# Patient Record
Sex: Female | Born: 1985 | Race: Black or African American | Hispanic: No | Marital: Married | Smoking: Former smoker
Health system: Southern US, Community
[De-identification: ages and names within clinical notes are randomized; demographics above are authoritative.]

## PROBLEM LIST (undated history)

## (undated) ENCOUNTER — Inpatient Hospital Stay (HOSPITAL_COMMUNITY): Payer: Self-pay

## (undated) DIAGNOSIS — IMO0002 Reserved for concepts with insufficient information to code with codable children: Secondary | ICD-10-CM

## (undated) DIAGNOSIS — B999 Unspecified infectious disease: Secondary | ICD-10-CM

## (undated) DIAGNOSIS — O34219 Maternal care for unspecified type scar from previous cesarean delivery: Secondary | ICD-10-CM

## (undated) DIAGNOSIS — T8859XA Other complications of anesthesia, initial encounter: Secondary | ICD-10-CM

## (undated) DIAGNOSIS — Z8619 Personal history of other infectious and parasitic diseases: Secondary | ICD-10-CM

## (undated) DIAGNOSIS — R102 Pelvic and perineal pain unspecified side: Secondary | ICD-10-CM

## (undated) DIAGNOSIS — N857 Hematometra: Secondary | ICD-10-CM

## (undated) DIAGNOSIS — Z973 Presence of spectacles and contact lenses: Secondary | ICD-10-CM

## (undated) DIAGNOSIS — I10 Essential (primary) hypertension: Secondary | ICD-10-CM

## (undated) DIAGNOSIS — T4145XA Adverse effect of unspecified anesthetic, initial encounter: Secondary | ICD-10-CM

## (undated) DIAGNOSIS — S4290XA Fracture of unspecified shoulder girdle, part unspecified, initial encounter for closed fracture: Secondary | ICD-10-CM

## (undated) DIAGNOSIS — R51 Headache: Secondary | ICD-10-CM

## (undated) HISTORY — DX: Fracture of unspecified shoulder girdle, part unspecified, initial encounter for closed fracture: S42.90XA

## (undated) HISTORY — PX: BREAST SURGERY: SHX581

## (undated) HISTORY — DX: Adverse effect of unspecified anesthetic, initial encounter: T41.45XA

## (undated) HISTORY — DX: Reserved for concepts with insufficient information to code with codable children: IMO0002

## (undated) HISTORY — DX: Unspecified infectious disease: B99.9

## (undated) HISTORY — DX: Essential (primary) hypertension: I10

## (undated) HISTORY — PX: REPAIR VAGINAL CUFF: SHX6067

## (undated) HISTORY — DX: Other complications of anesthesia, initial encounter: T88.59XA

## (undated) HISTORY — DX: Headache: R51

---
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
## 2006-09-10 DIAGNOSIS — B999 Unspecified infectious disease: Secondary | ICD-10-CM

## 2006-09-10 HISTORY — DX: Unspecified infectious disease: B99.9

## 2009-02-19 ENCOUNTER — Inpatient Hospital Stay (HOSPITAL_COMMUNITY): Admission: AD | Admit: 2009-02-19 | Discharge: 2009-02-20 | Payer: Self-pay | Admitting: Obstetrics and Gynecology

## 2009-04-07 ENCOUNTER — Inpatient Hospital Stay (HOSPITAL_COMMUNITY): Admission: AD | Admit: 2009-04-07 | Discharge: 2009-04-07 | Payer: Self-pay | Admitting: Obstetrics and Gynecology

## 2009-05-06 ENCOUNTER — Inpatient Hospital Stay (HOSPITAL_COMMUNITY): Admission: AD | Admit: 2009-05-06 | Discharge: 2009-05-06 | Payer: Self-pay | Admitting: Obstetrics and Gynecology

## 2009-05-23 ENCOUNTER — Inpatient Hospital Stay (HOSPITAL_COMMUNITY): Admission: AD | Admit: 2009-05-23 | Discharge: 2009-05-23 | Payer: Self-pay | Admitting: Obstetrics and Gynecology

## 2009-05-26 ENCOUNTER — Inpatient Hospital Stay (HOSPITAL_COMMUNITY): Admission: AD | Admit: 2009-05-26 | Discharge: 2009-05-31 | Payer: Self-pay | Admitting: Obstetrics and Gynecology

## 2009-05-28 ENCOUNTER — Encounter (INDEPENDENT_AMBULATORY_CARE_PROVIDER_SITE_OTHER): Payer: Self-pay | Admitting: Obstetrics and Gynecology

## 2009-08-05 IMAGING — US US OB TRANSVAGINAL
1 series · 14 of 14 positions shown · non-contrast
Comparison: none

OBSTETRICAL ULTRASOUND:
 This ultrasound exam was performed in the [HOSPITAL] Ultrasound Department.  The OB US report was generated in the AS system, and faxed to the ordering physician.  This report is also available in [REDACTED] PACS.

[Series 1: us ob transvaginal · 0.27mm/px · 14 of 14 slices shown]
[im 1/14]
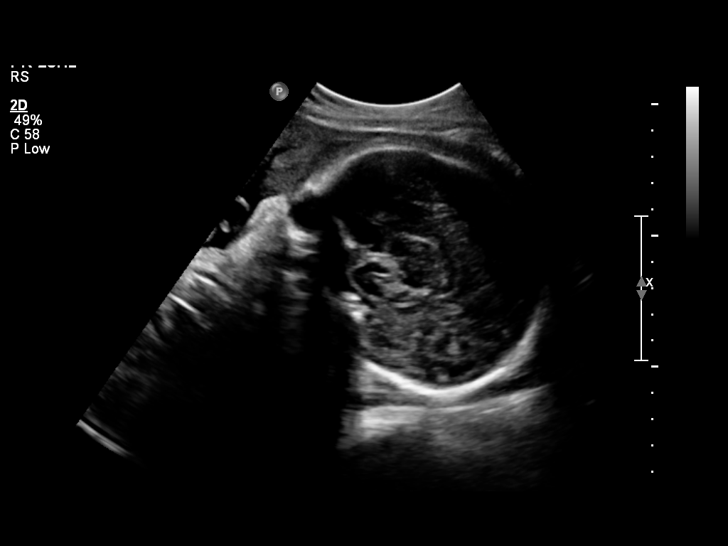
[im 2/14]
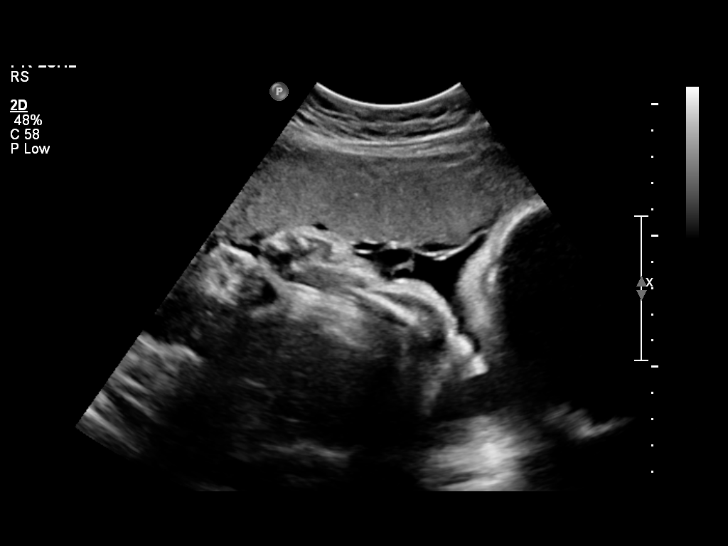
[im 3/14]
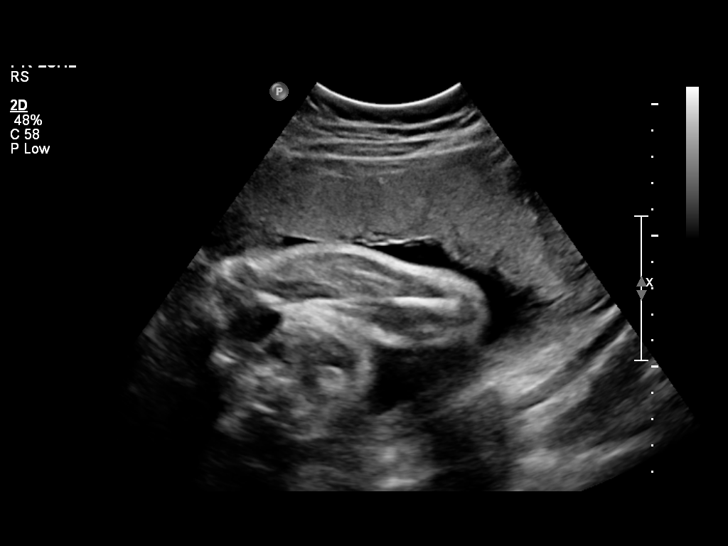
[im 4/14]
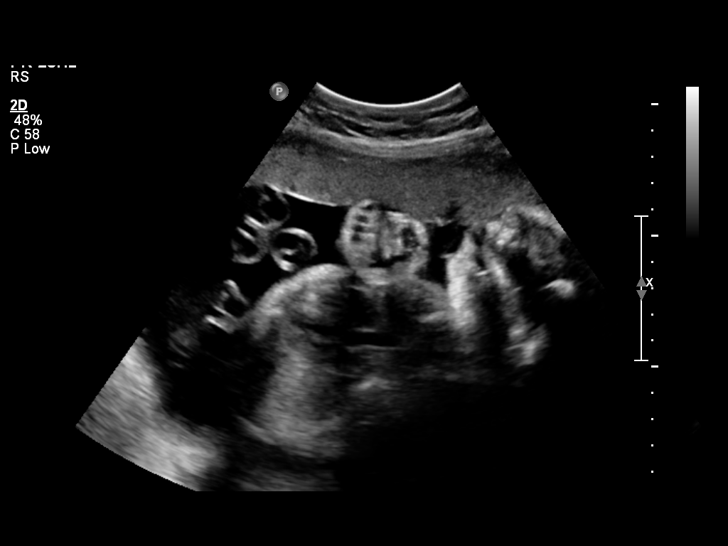
[im 5/14]
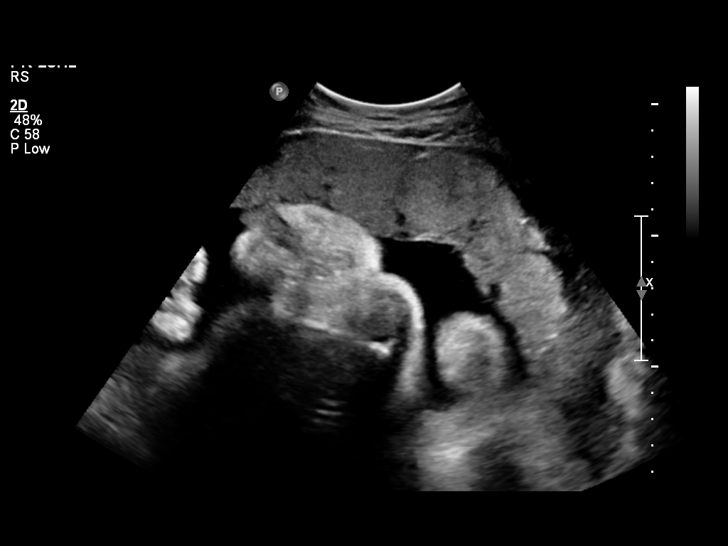
[im 6/14]
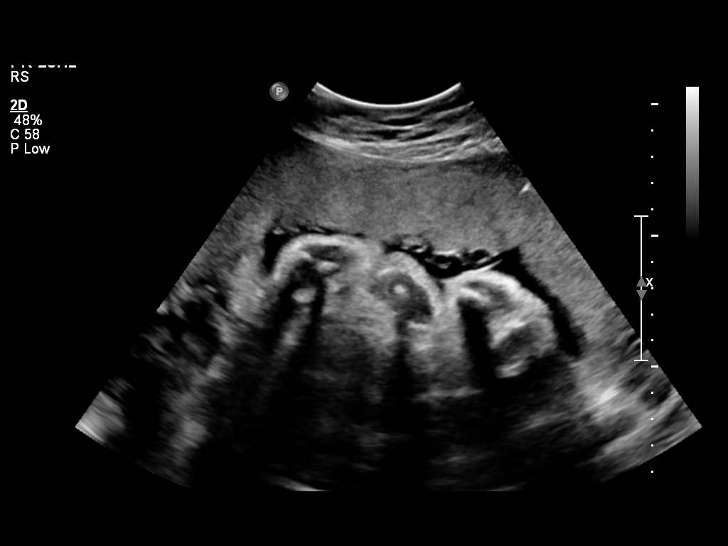
[im 7/14]
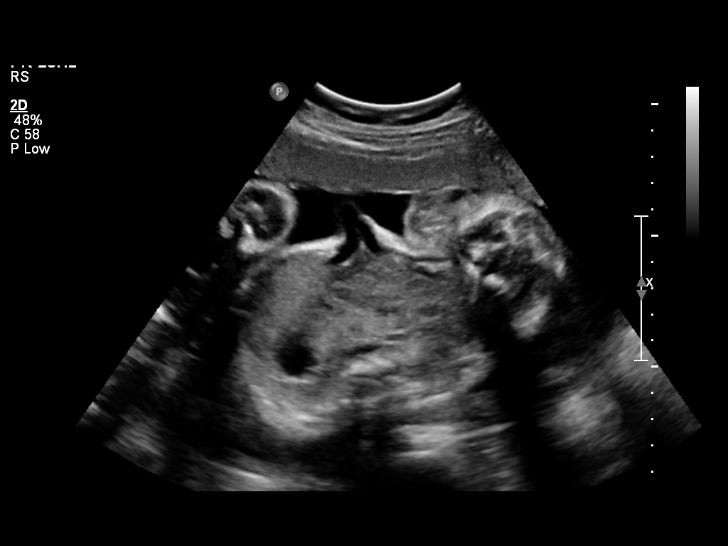
[im 8/14]
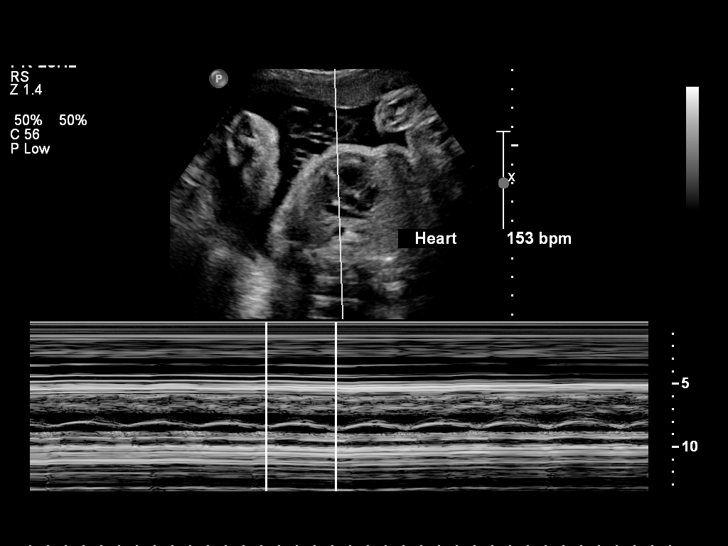
[im 9/14]
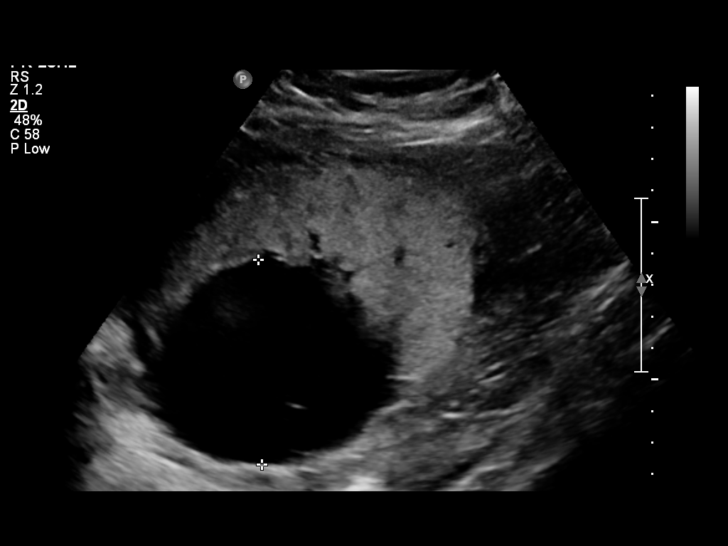
[im 10/14]
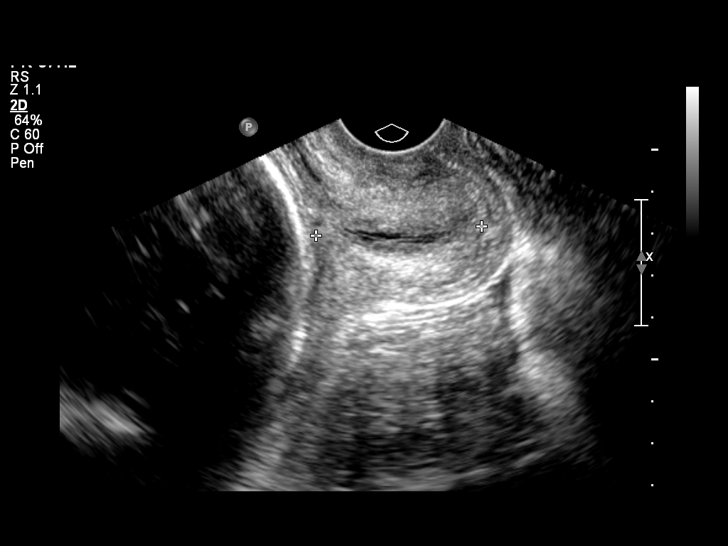
[im 11/14]
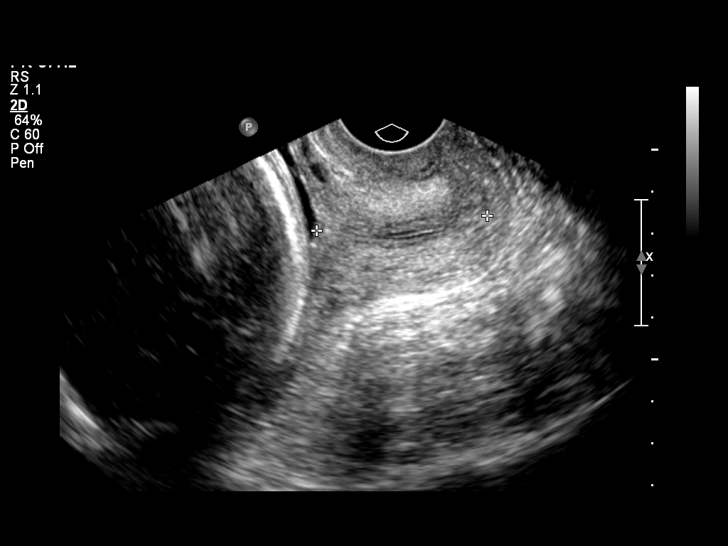
[im 12/14]
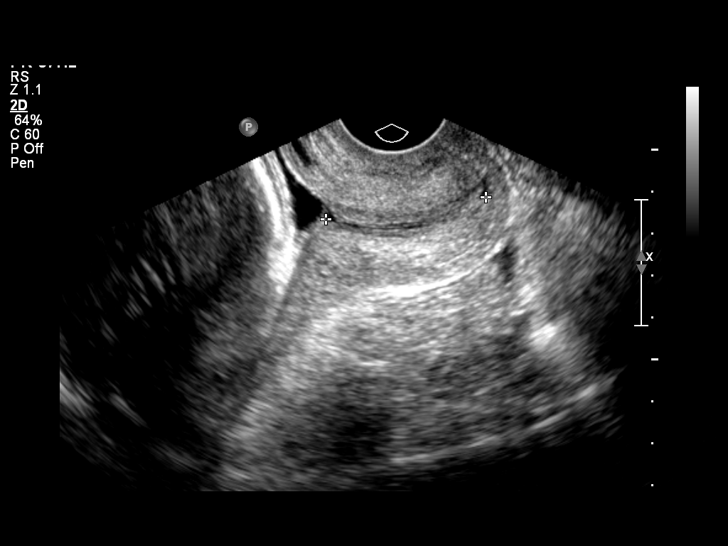
[im 13/14]
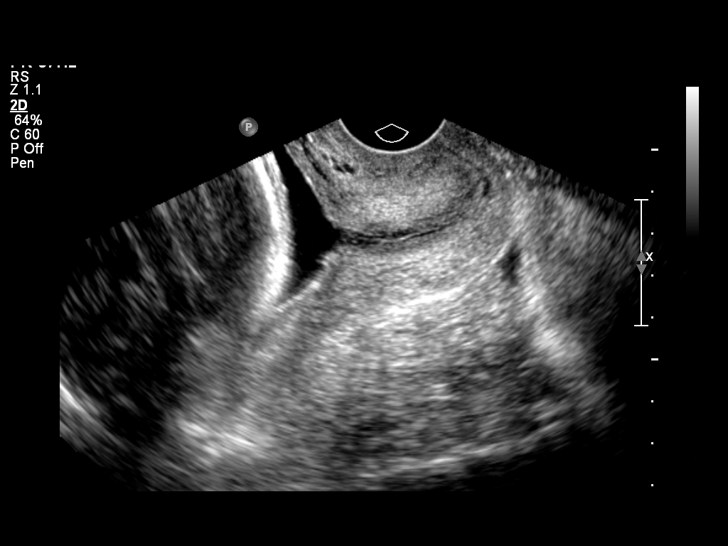
[im 14/14]
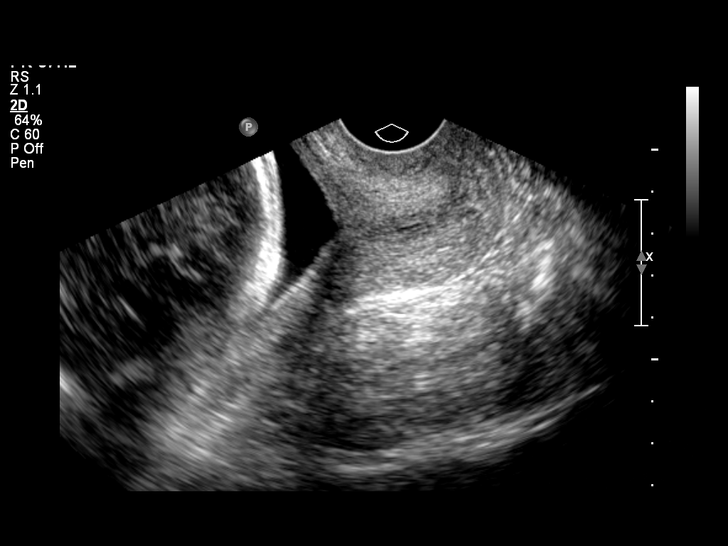

[14 of 14 positions shown; findings below may reference images not displayed]

IMPRESSION: See AS Obstetric US report.

## 2010-05-27 ENCOUNTER — Emergency Department (HOSPITAL_COMMUNITY): Admission: EM | Admit: 2010-05-27 | Discharge: 2010-05-27 | Payer: Self-pay | Admitting: Emergency Medicine

## 2010-09-10 DIAGNOSIS — Z8742 Personal history of other diseases of the female genital tract: Secondary | ICD-10-CM

## 2010-09-10 DIAGNOSIS — IMO0002 Reserved for concepts with insufficient information to code with codable children: Secondary | ICD-10-CM

## 2010-09-10 HISTORY — DX: Personal history of other diseases of the female genital tract: Z87.42

## 2010-09-10 HISTORY — DX: Reserved for concepts with insufficient information to code with codable children: IMO0002

## 2010-11-23 LAB — POCT I-STAT, CHEM 8
Chloride: 106 mEq/L (ref 96–112)
HCT: 43 % (ref 36.0–46.0)
Hemoglobin: 14.6 g/dL (ref 12.0–15.0)
Potassium: 3.6 mEq/L (ref 3.5–5.1)
Sodium: 141 mEq/L (ref 135–145)

## 2010-12-15 LAB — WET PREP, GENITAL
Trich, Wet Prep: NONE SEEN
Yeast Wet Prep HPF POC: NONE SEEN

## 2010-12-15 LAB — CBC
HCT: 27.3 % — ABNORMAL LOW (ref 36.0–46.0)
HCT: 36.2 % (ref 36.0–46.0)
Hemoglobin: 12.1 g/dL (ref 12.0–15.0)
Hemoglobin: 9.4 g/dL — ABNORMAL LOW (ref 12.0–15.0)
MCHC: 33.4 g/dL (ref 30.0–36.0)
MCHC: 34.3 g/dL (ref 30.0–36.0)
MCV: 87.5 fL (ref 78.0–100.0)
Platelets: 189 10*3/uL (ref 150–400)
RBC: 3.13 MIL/uL — ABNORMAL LOW (ref 3.87–5.11)
RBC: 4.13 MIL/uL (ref 3.87–5.11)
RDW: 13.5 % (ref 11.5–15.5)
WBC: 9.1 10*3/uL (ref 4.0–10.5)

## 2010-12-15 LAB — RPR: RPR Ser Ql: NONREACTIVE

## 2010-12-16 LAB — URINALYSIS, ROUTINE W REFLEX MICROSCOPIC
Bilirubin Urine: NEGATIVE
Glucose, UA: NEGATIVE mg/dL
Ketones, ur: 15 mg/dL — AB
Nitrite: NEGATIVE
Protein, ur: NEGATIVE mg/dL

## 2010-12-17 LAB — URINALYSIS, ROUTINE W REFLEX MICROSCOPIC
Glucose, UA: 100 mg/dL — AB
Ketones, ur: NEGATIVE mg/dL
Specific Gravity, Urine: <1.005 — ABNORMAL LOW (ref 1.005–1.030)
pH: 7 (ref 5.0–8.0)

## 2010-12-17 LAB — WET PREP, GENITAL: Yeast Wet Prep HPF POC: NONE SEEN

## 2010-12-17 LAB — URINE MICROSCOPIC-ADD ON

## 2010-12-18 LAB — URINALYSIS, ROUTINE W REFLEX MICROSCOPIC
Bilirubin Urine: NEGATIVE
Glucose, UA: NEGATIVE mg/dL
Hgb urine dipstick: NEGATIVE
Ketones, ur: NEGATIVE mg/dL
Nitrite: NEGATIVE
Specific Gravity, Urine: <1.005 — ABNORMAL LOW (ref 1.005–1.030)
pH: 7 (ref 5.0–8.0)

## 2010-12-18 LAB — FETAL FIBRONECTIN: Fetal Fibronectin: NEGATIVE

## 2010-12-18 LAB — WET PREP, GENITAL
Trich, Wet Prep: NONE SEEN
Yeast Wet Prep HPF POC: NONE SEEN

## 2010-12-18 LAB — GC/CHLAMYDIA PROBE AMP, GENITAL: Chlamydia, DNA Probe: NEGATIVE

## 2011-01-26 NOTE — Op Note (Signed)
   Donna [name], Donna [name]                          ACCOUNT NO.:  [id number]   MEDICAL RECORD NO.:  [id number]                   PATIENT TYPE:  OIB   LOCATION:  2899                                 FACILITY:  [facility]   PHYSICIAN:  Leonie Man, M.D.                DATE OF BIRTH:  [birth year]   DATE OF PROCEDURE:  03/19/2003  DATE OF DISCHARGE:                                 OPERATIVE REPORT   PREOPERATIVE DIAGNOSIS:  Fibroadenoma, left breast.   POSTOPERATIVE DIAGNOSIS:  Fibroadenoma, left breast, path pending.   PROCEDURE:  Excisional biopsy of left breast mass.   SURGEON:  Mardene Celeste. Lurene Shadow, M.D.   ASSISTANT:  Nurse   ANESTHESIA:  General.   This patient is a 25 year old female with an enlarging mass in the left  breast.  On palpation, is clinically a fibroadenoma.  She comes to the  operating room for excision of this mass after a full discussion with her  and her mother concerning the risks and potential benefits of surgery.  They  gave consent and the patient comes to the operating room for excision of  this left breast mass.   PROCEDURE:  Following the induction of satisfactory general anesthesia, the  patient was positioned supine.  The left breast was prepped and draped to be  included in the sterile operative field.  A circumareolar incision on the  upper outer quadrant of the areolar border was deepened through the skin and  subcutaneous tissue.  A flap is raised superiorly and laterally and  dissection was carried up to the region of the mass.  The mass was dissected  free from the surrounding breast tissues and removed in its entirety and  forwarded for pathologic evaluation.  Hemostasis was obtained with  electrocautery.  The breast tissues were reapproximated with 2-0 Vicryl  suture, the subcutaneous tissue were closed with interrupted 3-0 Vicryl  sutures, and the skin was closed with running 5-0 Monocryl suture reinforced  with Steri-Strips.  Sterile dressings  were applied, the anesthetic reversed,  and the patient removed from the operating room to the recovery room in  stable condition, having tolerated the procedure well.                                               Leonie Man, M.D.    PB/MEDQ  D:  03/19/2003  T:  03/19/2003  Job:  [id number]

## 2011-03-17 ENCOUNTER — Encounter: Payer: Self-pay | Admitting: Family Medicine

## 2011-03-17 ENCOUNTER — Inpatient Hospital Stay (INDEPENDENT_AMBULATORY_CARE_PROVIDER_SITE_OTHER)
Admission: RE | Admit: 2011-03-17 | Discharge: 2011-03-17 | Disposition: A | Discharge status: Home / Self Care | Payer: BC Managed Care – PPO | Source: Ambulatory Visit | Attending: Family Medicine | Admitting: Family Medicine

## 2011-03-17 DIAGNOSIS — IMO0002 Reserved for concepts with insufficient information to code with codable children: Secondary | ICD-10-CM

## 2011-03-17 DIAGNOSIS — M75100 Unspecified rotator cuff tear or rupture of unspecified shoulder, not specified as traumatic: Secondary | ICD-10-CM

## 2011-03-17 DIAGNOSIS — M7520 Bicipital tendinitis, unspecified shoulder: Secondary | ICD-10-CM

## 2011-08-13 NOTE — Letter (Signed)
Summary: Out of Work  [facility]  [address]   [place], [place] [address]   Phone: [phone]  Fax: [phone]    March 17, 2011   Employee:  Nani Skillern    To Whom It May Concern:   Katia was evaluated in our clinic today.   If you need additional information, please feel free to contact our office.         Sincerely,    Donna Christen MD

## 2011-08-13 NOTE — Progress Notes (Signed)
Summary: RT SHOULDER SORENESS (room 5)   Vital Signs:  Patient Profile:   25 Years Old Female CC:      right shoulder pain and decrease ROM several days Height:     64 inches Weight:      143 pounds O2 Sat:      100 % O2 treatment:    Room Air Temp:     98.8 degrees F oral Pulse rate:   82 / minute Resp:     16 per minute BP sitting:   103 / 65  (left arm) Cuff size:   regular  Pt. in pain?   yes    Location:   right shoulder  Vitals Entered By: Lavell Islam RN (March 17, 2011 2:22 PM)                   Prior Medication List:  No prior medications documented  Updated Prior Medication List: No Medications Current Allergies: ! VICODIN (HYDROCODONE-ACETAMINOPHEN)History of Present Illness Chief Complaint: right shoulder pain and decrease ROM several days History of Present Illness:  Subjective:  Patient complains of 2 day history of vague dull pain in her right shoulder area, worse with movement of her right arm.  She recalls no recent injury to the area or significant change in physical activities.  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  The pain has not improved much with Ibuprofen.  The pain does not radiate.  No chest pain.  REVIEW OF SYSTEMS Constitutional Symptoms      Denies fever, chills, night sweats, weight loss, weight gain, and fatigue.  Eyes       Denies change in vision, eye pain, eye discharge, glasses, contact lenses, and eye surgery. Ear/Nose/Throat/Mouth       Denies hearing loss/aids, change in hearing, ear pain, ear discharge, dizziness, frequent runny nose, frequent nose bleeds, sinus problems, sore throat, hoarseness, and tooth pain or bleeding.  Respiratory       Denies dry cough, productive cough, wheezing, shortness of breath, asthma, bronchitis, and emphysema/COPD.  Cardiovascular       Denies murmurs, chest pain, and tires easily with exhertion.    Gastrointestinal       Denies stomach pain, nausea/vomiting, diarrhea,  constipation, blood in bowel movements, and indigestion. Genitourniary       Denies painful urination, kidney stones, and loss of urinary control. Neurological       Denies paralysis, seizures, and fainting/blackouts. Musculoskeletal       Complains of muscle pain, joint pain, and joint stiffness.      Denies decreased range of motion, redness, swelling, muscle weakness, and gout.      Comments: right shoulder Skin       Denies bruising, unusual mles/lumps or sores, and hair/skin or nail changes.  Psych       Denies mood changes, temper/anger issues, anxiety/stress, speech problems, depression, and sleep problems. Other Comments: right shoulder pain and decreased rom   Past History:  Family History: Last updated: 03/17/2011 Family History Hypertension cancer  Social History: Last updated: 03/17/2011 Never Smoked Alcohol use-no Drug use-no works in retirement center; heavy lifting  Past Medical History: Unremarkable  Past Surgical History: Caesarean section Left breast cyst removal right shoulder surgery in childhood  Family History: Family History Hypertension cancer  Social History: Never Smoked Alcohol use-no Drug use-no works in retirement center; heavy lifting Smoking Status:  never Drug Use:  no   Objective:  Appearance:  Patient appears  healthy, stated age, and in no acute distress  Pharynx:  Normal  Neck:  Supple.  No adenopathy is present.  No tenderness Lungs:  Clear to auscultation.  Breath sounds are equal.  Heart:  Regular rate and rhythm without murmurs, rubs, or gallops.  Right shoulder:  Good range of motion although has mild discomfort with abduction above horizontal.  Distinct tenderness over insertion of biceps tendons, especially with resisted flexion of right elbow.  There is distinct tenderness at distal site of sub-acromial bursa.  Distal neurovascular intact   Assessment New Problems: SUBACROMIAL BURSITIS, RIGHT (ICD-726.19) BICEPS  TENDINITIS, RIGHT (ICD-726.12)   Plan New Orders: Services provided After hours-Weekends-Holidays [99051] New Patient Level III [99203] Planning Comments:   Begin Ibuprofen 200mg , 4 tabs every 8 hours with food.  Begin applying ice pack several times daily.  Begin range of motion and stretching exercises ([facility] information and instruction patient handout given)  Followup with [facility] if not improved in two weeks.    Diagnoses and expected course of recovery discussed and will return if not improved as expected or if the condition worsens. Patient and/or caregiver verbalized understanding.   Orders Added: 1)  Services provided After hours-Weekends-Holidays [99051] 2)  New Patient Level III [96045]

## 2011-08-17 ENCOUNTER — Emergency Department (HOSPITAL_COMMUNITY)
Admission: EM | Admit: 2011-08-17 | Discharge: 2011-08-18 | Disposition: A | Discharge status: Home / Self Care | Payer: BC Managed Care – PPO | Attending: Emergency Medicine | Admitting: Emergency Medicine

## 2011-08-17 ENCOUNTER — Encounter: Payer: Self-pay | Admitting: *Deleted

## 2011-08-17 DIAGNOSIS — M545 Low back pain, unspecified: Secondary | ICD-10-CM | POA: Insufficient documentation

## 2011-08-17 LAB — URINALYSIS, ROUTINE W REFLEX MICROSCOPIC
Bilirubin Urine: NEGATIVE
Hgb urine dipstick: NEGATIVE
Ketones, ur: NEGATIVE mg/dL
Nitrite: NEGATIVE
Protein, ur: NEGATIVE mg/dL
Urobilinogen, UA: 1 mg/dL (ref 0.0–1.0)

## 2011-08-17 NOTE — ED Notes (Signed)
C/o back for 1 year, on & off for 1 year, recent UTI dx'd at [facility]. Also nausea, HA, recent cold sx, intermittant chills & mentions L leg tingling (mild), (denies: radiation of pain, fever, bleeding, vd, urinary or vaginal sx). Taking cipro.

## 2011-08-18 ENCOUNTER — Emergency Department (HOSPITAL_COMMUNITY): Payer: BC Managed Care – PPO

## 2011-08-18 ENCOUNTER — Encounter (HOSPITAL_COMMUNITY): Payer: Self-pay | Admitting: Emergency Medicine

## 2011-08-18 MED ORDER — TRAMADOL HCL 50 MG PO TABS: 50.0000 mg | ORAL_TABLET | Freq: Four times a day (QID) | ORAL | Status: AC | PRN

## 2011-08-18 MED ORDER — METHOCARBAMOL 500 MG PO TABS
500.0000 mg | ORAL_TABLET | Freq: Once | ORAL | Status: AC
  Administered 2011-08-18: 500 mg via ORAL
  Filled 2011-08-18: qty 1

## 2011-08-18 MED ORDER — METHOCARBAMOL 500 MG PO TABS: 500.0000 mg | ORAL_TABLET | Freq: Two times a day (BID) | ORAL | Status: AC

## 2011-08-18 NOTE — ED Notes (Signed)
Rx x 2, pt voiced understanding to f/u with PCP and return for worsening of sx.

## 2011-08-18 NOTE — ED Provider Notes (Signed)
History     CSN: [id number] Arrival date & time: 08/17/2011  9:19 PM   First MD Initiated Contact with Patient 08/18/11 [phone]      Chief Complaint  Patient presents with  . Back Pain     HPI  History provided by the patient and significant other. Patient presents with complaints of increased low back pain and discomfort. Patient reports similar symptoms off and on for the previous year. Toes are worse with prolonged sitting or some movements. She states she was seen recently in [facility] for similar symptoms and states she was found to have a urinary tract infection for which she was taking antibiotics. She states she's had no improvement of symptoms since taking antibiotics. Patient denies any dysuria, urinary frequency, hematuria, vaginal discharge, vaginal bleeding, vaginal pain. Patient denies any recent weight loss, night sweats, numbness, tingling, weakness in lower extremities, urinary or bowel incontinence. She has had no trauma or significant injury. 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 She does not note any significant incident recently. Patient denies any other significant past medical history   History reviewed. No pertinent past medical history.  Past Surgical History  Procedure Date  . Breast surgery   . Cesarean section     Family History  Problem Relation Age of Onset  . Hypertension Mother   . Cancer Father     History  Substance Use Topics  . Smoking status: Never Smoker   . Smokeless tobacco: Not on file  . Alcohol Use: No    OB History    Grav Para Term Preterm Abortions TAB SAB Ect Mult Living                  Review of Systems  Constitutional: Negative for fever, chills and unexpected weight change.  Respiratory: Negative for cough and shortness of breath.   Cardiovascular: Negative for chest pain.  Gastrointestinal: Negative for nausea, vomiting, abdominal pain, diarrhea and constipation.  Genitourinary:  Negative for dysuria, frequency, hematuria, flank pain, vaginal bleeding, vaginal discharge, vaginal pain and menstrual problem.  Musculoskeletal: Positive for back pain. Negative for arthralgias and gait problem.  Skin: Negative for rash.  All other systems reviewed and are negative.    Allergies  Hydrocodone-acetaminophen  Home Medications   Current Outpatient Rx  Name Route Sig Dispense Refill  . CIPROFLOXACIN HCL 500 MG PO TABS Oral Take 500 mg by mouth 2 (two) times daily. For 5 days; Start 08/12/11     . MELOXICAM 15 MG PO TABS Oral Take 15 mg by mouth daily as needed. For pain/inflammation        BP 109/69  Pulse 89  Temp(Src) 98.1 F (36.7 C) (Oral)  Resp 17  SpO2 98%  LMP 07/30/2011  Physical Exam  Nursing note and vitals reviewed. Constitutional: She is oriented to person, place, and time. She appears well-developed and well-nourished. No distress.  HENT:  Head: Normocephalic.  Neck: Normal range of motion. Neck supple.  Cardiovascular: Normal rate, regular rhythm and normal heart sounds.   Pulmonary/Chest: Effort normal and breath sounds normal. No respiratory distress. She has no wheezes.  Abdominal: Soft. She exhibits no distension. There is no tenderness. There is no rebound and no guarding.  Musculoskeletal:       Cervical back: Normal.       Thoracic back: Normal.       Lumbar back: She exhibits tenderness. She exhibits normal range of motion, no bony  tenderness, no swelling and no deformity.       Paralumbar spinal tenderness.  Neurological: She is alert and oriented to person, place, and time. She has normal strength. No cranial nerve deficit or sensory deficit. Gait normal.  Skin: Skin is warm. No rash noted.  Psychiatric: She has a normal mood and affect. Her behavior is normal.    ED Course  Procedures (including critical care time)  Labs Reviewed  URINALYSIS, ROUTINE W REFLEX MICROSCOPIC - Abnormal; Notable for the following:    Specific  Gravity, Urine 1.036 (*)    All other components within normal limits  POCT PREGNANCY, URINE  POCT PREGNANCY, URINE   Results for orders placed during the hospital encounter of 08/17/11  URINALYSIS, ROUTINE W REFLEX MICROSCOPIC      Component Value Range   Color, Urine YELLOW  YELLOW    APPearance CLEAR  CLEAR    Specific Gravity, Urine 1.036 (*) 1.005 - 1.030    pH 7.5  5.0 - 8.0    Glucose, UA NEGATIVE  NEGATIVE (mg/dL)   Hgb urine dipstick NEGATIVE  NEGATIVE    Bilirubin Urine NEGATIVE  NEGATIVE    Ketones, ur NEGATIVE  NEGATIVE (mg/dL)   Protein, ur NEGATIVE  NEGATIVE (mg/dL)   Urobilinogen, UA 1.0  0.0 - 1.0 (mg/dL)   Nitrite NEGATIVE  NEGATIVE    Leukocytes, UA NEGATIVE  NEGATIVE   POCT PREGNANCY, URINE      Component Value Range   Preg Test, Ur NEGATIVE       Dg Lumbar Spine Complete  08/18/2011  *RADIOLOGY REPORT*  Clinical Data: Nontraumatic low back pain which has worsened steadily over the past 3 months.  LUMBAR SPINE - COMPLETE 4+ VIEW 08/18/2011:  Comparison: None.  Findings: Five non-rib bearing lumbar vertebra with anatomic alignment.  No visible fractures.  Well-preserved disc spaces.  No pars defects.  No significant facet arthropathy.  No evidence of spondylosis.  Visualized sacroiliac joints intact.  IMPRESSION: Normal examination.  Original Report Authenticated By: Arnell Sieving, M.D.     1. Low back pain       MDM  1:00 AM patient seen and evaluated. Patient in no acute distress. Patient with normal gait. No red flags for low back pain.        Angus Seller, [place] 08/18/11 [phone]

## 2011-08-19 NOTE — ED Provider Notes (Signed)
Medical screening examination/treatment/procedure(s) were performed by non-physician practitioner and as supervising physician I was immediately available for consultation/collaboration.   Kealan Buchan, MD 08/19/11 0726 

## 2012-01-22 ENCOUNTER — Encounter: Payer: Self-pay | Admitting: Obstetrics and Gynecology

## 2012-01-22 ENCOUNTER — Ambulatory Visit (INDEPENDENT_AMBULATORY_CARE_PROVIDER_SITE_OTHER): Payer: BC Managed Care – PPO | Admitting: Obstetrics and Gynecology

## 2012-01-22 VITALS — BP 118/72 | Resp 16 | Wt 155.0 lb

## 2012-01-22 DIAGNOSIS — Z309 Encounter for contraceptive management, unspecified: Secondary | ICD-10-CM

## 2012-01-22 DIAGNOSIS — B977 Papillomavirus as the cause of diseases classified elsewhere: Secondary | ICD-10-CM

## 2012-01-22 DIAGNOSIS — N946 Dysmenorrhea, unspecified: Secondary | ICD-10-CM

## 2012-01-22 DIAGNOSIS — N870 Mild cervical dysplasia: Secondary | ICD-10-CM

## 2012-01-22 DIAGNOSIS — R8789 Other abnormal findings in specimens from female genital organs: Secondary | ICD-10-CM

## 2012-01-22 DIAGNOSIS — Z98891 History of uterine scar from previous surgery: Secondary | ICD-10-CM

## 2012-01-22 DIAGNOSIS — Z9889 Other specified postprocedural states: Secondary | ICD-10-CM

## 2012-01-22 DIAGNOSIS — B009 Herpesviral infection, unspecified: Secondary | ICD-10-CM

## 2012-01-22 NOTE — Progress Notes (Signed)
Ms. Donna [name] is a 26 y.o. year old female,G1P1001, who presents for removal of Implanon that was placed 3 years ago.  Subjective:  Ready to get pregnant.  Objective:  BP 118/72  Resp 16  Wt 155 lb (70.308 kg)  LMP 01/17/2012   Procedure:  Betadine prepped the left upper arm.  2 cc of buffered lidocaine injected into the skin.  Incision made at the site of Implanon.  Implant removed without difficulty.  Tolerated well.  A sterile wrap applied.  Exam deferred.  Assessment:  Ready to get pregnant. Implanon removal.  Plan:  Preconception issues reviewed.  Prenatal vitamins, tuna, soft cheese, lunch meat, and healthy pregnancy issues outlined.  Return to office prn if symptoms worsen or fail to improve.   Leonard Schwartz M.D.  01/22/2012 9:00 PM

## 2012-06-05 ENCOUNTER — Telehealth: Payer: Self-pay | Admitting: Obstetrics and Gynecology

## 2012-06-06 ENCOUNTER — Other Ambulatory Visit (INDEPENDENT_AMBULATORY_CARE_PROVIDER_SITE_OTHER): Payer: BC Managed Care – PPO

## 2012-06-06 DIAGNOSIS — N912 Amenorrhea, unspecified: Secondary | ICD-10-CM

## 2012-06-06 LAB — POCT URINE PREGNANCY: Preg Test, Ur: POSITIVE

## 2012-06-06 NOTE — Progress Notes (Unsigned)
Pt here for UPT -Positive.  Pt is 4 weeks 2 days.  Pt to sch NOB work-up and interview.  PNV samples given.

## 2012-06-13 ENCOUNTER — Telehealth: Payer: Self-pay | Admitting: Obstetrics and Gynecology

## 2012-06-13 NOTE — Telephone Encounter (Signed)
TC from pt. States LMP 05/06/12.  +UPT.   Is having abd cramping in umbilical area.  +Nausea. No bleeding.  Has not been having normal BM, somewhat constipated. Discussed diet and comfort measures for nausea, constipation, advised 8-10 glasses water/day. May take Colace.  To call if no improvement or any concerns. Pt verbalizes comprehension.

## 2012-06-20 ENCOUNTER — Ambulatory Visit (INDEPENDENT_AMBULATORY_CARE_PROVIDER_SITE_OTHER): Payer: BC Managed Care – PPO | Admitting: Obstetrics and Gynecology

## 2012-06-20 DIAGNOSIS — Z331 Pregnant state, incidental: Secondary | ICD-10-CM

## 2012-06-20 LAB — POCT URINALYSIS DIPSTICK
Bilirubin, UA: NEGATIVE
Blood, UA: NEGATIVE
Ketones, UA: 2
pH, UA: 6

## 2012-06-20 NOTE — Progress Notes (Signed)
NOB interview. Ketones in urine.  Pt states had not eaten today.  Discussed small frequent meals. Pt verbalizes comprehension.

## 2012-06-21 LAB — PRENATAL PANEL VII
Antibody Screen: NEGATIVE
Basophils Absolute: 0 10*3/uL (ref 0.0–0.1)
Basophils Relative: 0 % (ref 0–1)
Eosinophils Relative: 2 % (ref 0–5)
HCT: 40 % (ref 36.0–46.0)
HIV: NONREACTIVE
Hemoglobin: 14.2 g/dL (ref 12.0–15.0)
Lymphocytes Relative: 24 % (ref 12–46)
MCHC: 35.5 g/dL (ref 30.0–36.0)
MCV: 83.7 fL (ref 78.0–100.0)
Monocytes Absolute: 0.5 10*3/uL (ref 0.1–1.0)
Monocytes Relative: 7 % (ref 3–12)
Neutro Abs: 4.5 10*3/uL (ref 1.7–7.7)
RDW: 12.5 % (ref 11.5–15.5)
Rh Type: POSITIVE

## 2012-07-03 ENCOUNTER — Telehealth: Payer: Self-pay | Admitting: Obstetrics and Gynecology

## 2012-07-03 ENCOUNTER — Encounter: Payer: BC Managed Care – PPO | Admitting: Obstetrics and Gynecology

## 2012-07-03 ENCOUNTER — Other Ambulatory Visit: Payer: Self-pay | Admitting: Obstetrics and Gynecology

## 2012-07-03 MED ORDER — PROMETHAZINE HCL 25 MG PO TABS: 25.0000 mg | ORAL_TABLET | Freq: Four times a day (QID) | ORAL | Status: DC | PRN

## 2012-07-03 MED ORDER — PROMETHAZINE HCL 12.5 MG RE SUPP: 12.5000 mg | Freq: Four times a day (QID) | RECTAL | Status: DC | PRN

## 2012-07-03 NOTE — Telephone Encounter (Signed)
Tc to pt regarding msg.  States had vomiting after lunch and breakfast today.  Has also had vomiting on other days as well and now she has abdominal pain and and has vomited up some blood.  Pt advised pain and bldg possibly d/t vomiting.  Denies having any anti-emetics.  Per SL can rx Phenergan po and suppository to pt's pharmacy.  Also advised pt to eat several small meals through out the day and me mindful of what she  Is eating, decrease fried, oily, fatty foods, and to take Tylenol for any pain, pt voices agreement, will back with any concerns.

## 2012-07-04 ENCOUNTER — Encounter: Payer: BC Managed Care – PPO | Admitting: Obstetrics and Gynecology

## 2012-07-07 ENCOUNTER — Encounter: Payer: BC Managed Care – PPO | Admitting: Obstetrics and Gynecology

## 2012-07-18 ENCOUNTER — Encounter (HOSPITAL_COMMUNITY): Payer: Self-pay | Admitting: Emergency Medicine

## 2012-07-18 ENCOUNTER — Emergency Department (HOSPITAL_COMMUNITY): Payer: BC Managed Care – PPO

## 2012-07-18 ENCOUNTER — Emergency Department (HOSPITAL_COMMUNITY)
Admission: EM | Admit: 2012-07-18 | Discharge: 2012-07-18 | Disposition: A | Discharge status: Home / Self Care | Payer: BC Managed Care – PPO | Attending: Emergency Medicine | Admitting: Emergency Medicine

## 2012-07-18 DIAGNOSIS — Z79899 Other long term (current) drug therapy: Secondary | ICD-10-CM | POA: Insufficient documentation

## 2012-07-18 DIAGNOSIS — Y9389 Activity, other specified: Secondary | ICD-10-CM | POA: Insufficient documentation

## 2012-07-18 DIAGNOSIS — Z87828 Personal history of other (healed) physical injury and trauma: Secondary | ICD-10-CM | POA: Insufficient documentation

## 2012-07-18 DIAGNOSIS — R51 Headache: Secondary | ICD-10-CM | POA: Insufficient documentation

## 2012-07-18 DIAGNOSIS — Z331 Pregnant state, incidental: Secondary | ICD-10-CM | POA: Insufficient documentation

## 2012-07-18 DIAGNOSIS — I151 Hypertension secondary to other renal disorders: Secondary | ICD-10-CM | POA: Insufficient documentation

## 2012-07-18 DIAGNOSIS — S0990XA Unspecified injury of head, initial encounter: Secondary | ICD-10-CM | POA: Insufficient documentation

## 2012-07-18 NOTE — ED Notes (Signed)
G2 P1 A 0 L1 °
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
## 2012-07-18 NOTE — ED Notes (Signed)
Pt remains in US at this time.
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
## 2012-07-18 NOTE — ED Notes (Signed)
Patient transported to CT 

## 2012-07-18 NOTE — ED Provider Notes (Signed)
Medical screening examination/treatment/procedure(s) were conducted as a shared visit with non-physician practitioner(s) and myself.  I personally evaluated the patient during the encounter.  Porche Steinberger R. Reather Steller, MD 07/18/12 1612 

## 2012-07-18 NOTE — ED Notes (Signed)
No abd pain  Or cramping she states just wants to be checked out no numbness or tingling. she states she is now having neck pain . Pt tearful
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
## 2012-07-18 NOTE — ED Notes (Signed)
Unable to auscultate fetal HT r/t gestational age

## 2012-07-21 ENCOUNTER — Encounter: Payer: Self-pay | Admitting: Obstetrics and Gynecology

## 2012-07-21 ENCOUNTER — Ambulatory Visit (INDEPENDENT_AMBULATORY_CARE_PROVIDER_SITE_OTHER): Payer: BC Managed Care – PPO | Admitting: Obstetrics and Gynecology

## 2012-07-21 DIAGNOSIS — Z331 Pregnant state, incidental: Secondary | ICD-10-CM

## 2012-07-21 NOTE — Patient Instructions (Addendum)
[email or web address]-[email or web address] - international cesarean awareness network, information about VBAC     ABCs of Pregnancy A Antepartum care is very important. Be sure you see your doctor and get prenatal care as soon as you think you are pregnant. At this time, you will be tested for infection, genetic abnormalities and potential problems with you and the pregnancy. This is the time to discuss diet, exercise, work, medications, labor, pain medication during labor and the possibility of a cesarean delivery. Ask any questions that may concern you. It is important to see your doctor regularly throughout your pregnancy. Avoid exposure to toxic substances and chemicals - such as cleaning solvents, lead and mercury, some insecticides, and paint. Pregnant women should avoid exposure to paint fumes, and fumes that cause you to feel ill, dizzy or faint. When possible, it is a good idea to have a pre-pregnancy consultation with your caregiver to begin some important recommendations your caregiver suggests such as, taking folic acid, exercising, quitting smoking, avoiding alcoholic beverages, etc. B Breastfeeding is the healthiest choice for both you and your baby. It has many nutritional benefits for the baby and health benefits for the mother. It also creates a very tight and loving bond between the baby and mother. Talk to your doctor, your family and friends, and your employer about how you choose to feed your baby and how they can support you in your decision. Not all birth defects can be prevented, but a woman can take actions that may increase her chance of having a healthy baby. Many birth defects happen very early in pregnancy, sometimes before a woman even knows she is pregnant. Birth defects or abnormalities of any child in your or the father's family should be discussed with your caregiver. Get a good support bra as your breast size changes. Wear it especially when you exercise and when nursing.  C Celebrate the news of your  pregnancy with the your spouse/father and family. Childbirth classes are helpful to take for you and the spouse/father because it helps to understand what happens during the pregnancy, labor and delivery. Cesarean delivery should be discussed with your doctor so you are prepared for that possibility. The pros and cons of circumcision if it is a boy, should be discussed with your pediatrician. Cigarette smoking during pregnancy can result in low birth weight babies. It has been associated with infertility, miscarriages, tubal pregnancies, infant death (mortality) and poor health (morbidity) in childhood. Additionally, cigarette smoking may cause long-term learning disabilities. If you smoke, you should try to quit before getting pregnant and not smoke during the pregnancy. Secondary smoke may also harm a mother and her developing baby. It is a good idea to ask people to stop smoking around you during your pregnancy and after the baby is born. Extra calcium is necessary when you are pregnant and is found in your prenatal vitamin, in dairy products, green leafy vegetables and in calcium supplements. D A healthy diet according to your current weight and height, along with vitamins and mineral supplements should be discussed with your caregiver. Domestic abuse or violence should be made known to your doctor right away to get the situation corrected. Drink more water when you exercise to keep hydrated. Discomfort of your back and legs usually develops and progresses from the middle of the second trimester through to delivery of the baby. This is because of the enlarging baby and uterus, which may also affect your balance. Do not take illegal drugs. Illegal drugs can seriously harm the  baby and you. Drink extra fluids (water is best) throughout pregnancy to help your body keep up with the increases in your blood volume. Drink at least 6 to 8 glasses of water, fruit juice, or milk each day. A good way to know you are  drinking enough fluid is when your urine looks almost like clear water or is very light yellow.  E Eat healthy to get the nutrients you and your unborn baby need. Your meals should include the five basic food groups. Exercise (30 minutes of light to moderate exercise a day) is important and encouraged during pregnancy, if there are no medical problems or problems with the pregnancy. Exercise that causes discomfort or dizziness should be stopped and reported to your caregiver. Emotions during pregnancy can change from being ecstatic to depression and should be understood by you, your partner and your family. F Fetal screening with ultrasound, amniocentesis and monitoring during pregnancy and labor is common and sometimes necessary. Take 400 micrograms of folic acid daily both before, when possible, and during the first few months of pregnancy to reduce the risk of birth defects of the brain and spine. All women who could possibly become pregnant should take a vitamin with folic acid, every day. It is also important to eat a healthy diet with fortified foods (enriched grain products, including cereals, rice, breads, and pastas) and foods with natural sources of folate (orange juice, green leafy vegetables, beans, peanuts, broccoli, asparagus, peas, and lentils). The father should be involved with all aspects of the pregnancy including, the prenatal care, childbirth classes, labor, delivery, and postpartum time. Fathers may also have emotional concerns about being a father, financial needs, and raising a family. G Genetic testing should be done appropriately. It is important to know your family and the father's history. If there have been problems with pregnancies or birth defects in your family, report these to your doctor. Also, genetic counselors can talk with you about the information you might need in making decisions about having a family. You can call a major medical center in your area for help in finding  a board-certified genetic counselor. Genetic testing and counseling should be done before pregnancy when possible, especially if there is a history of problems in the mother's or father's family. Certain ethnic backgrounds are more at risk for genetic defects. H Get familiar with the hospital where you will be having your baby. Get to know how long it takes to get there, the labor and delivery area, and the hospital procedures. Be sure your medical insurance is accepted there. Get your home ready for the baby including, clothes, the baby's room (when possible), furniture and car seat. Hand washing is important throughout the day, especially after handling raw meat and poultry, changing the baby's diaper or using the bathroom. This can help prevent the spread of many bacteria and viruses that cause infection. Your hair may become dry and thinner, but will return to normal a few weeks after the baby is born. Heartburn is a common problem that can be treated by taking antacids recommended by your caregiver, eating smaller meals 5 or 6 times a day, not drinking liquids when eating, drinking between meals and raising the head of your bed 2 to 3 inches. I Insurance to cover you, the baby, doctor and hospital should be reviewed so that you will be prepared to pay any costs not covered by your insurance plan. If you do not have medical insurance, there are usually clinics and  services available for you in your community. Take 30 milligrams of iron during your pregnancy as prescribed by your doctor to reduce the risk of low red blood cells (anemia) later in pregnancy. All women of childbearing age should eat a diet rich in iron. J There should be a joint effort for the mother, father and any other children to adapt to the pregnancy financially, emotionally, and psychologically during the pregnancy. Join a support group for moms-to-be. Or, join a class on parenting or childbirth. Have the family participate when  possible. K Know your limits. Let your caregiver know if you experience any of the following:   Pain of any kind.  Strong cramps.  You develop a lot of weight in a short period of time (5 pounds in 3 to 5 days).  Vaginal bleeding, leaking of amniotic fluid.  Headache, vision problems.  Dizziness, fainting, shortness of breath.  Chest pain.  Fever of 102 F (38.9 C) or higher.  Gush of clear fluid from your vagina.  Painful urination.  Domestic violence.  Irregular heartbeat (palpitations).  Rapid beating of the heart (tachycardia).  Constant feeling sick to your stomach (nauseous) and vomiting.  Trouble walking, fluid retention (edema).  Muscle weakness.  If your baby has decreased activity.  Persistent diarrhea.  Abnormal vaginal discharge.  Uterine contractions at 20-minute intervals.  Back pain that travels down your leg. L Learn and practice that what you eat and drink should be in moderation and healthy for you and your baby. Legal drugs such as alcohol and caffeine are important issues for pregnant women. There is no safe amount of alcohol a woman can drink while pregnant. Fetal alcohol syndrome, a disorder characterized by growth retardation, facial abnormalities, and central nervous system dysfunction, is caused by a woman's use of alcohol during pregnancy. Caffeine, found in tea, coffee, soft drinks and chocolate, should also be limited. Be sure to read labels when trying to cut down on caffeine during pregnancy. More than 200 foods, beverages, and over-the-counter medications contain caffeine and have a high salt content! There are coffees and teas that do not contain caffeine. M Medical conditions such as diabetes, epilepsy, and high blood pressure should be treated and kept under control before pregnancy when possible, but especially during pregnancy. Ask your caregiver about any medications that may need to be changed or adjusted during pregnancy. If you  are currently taking any medications, ask your caregiver if it is safe to take them while you are pregnant or before getting pregnant when possible. Also, be sure to discuss any herbs or vitamins you are taking. They are medicines, too! Discuss with your doctor all medications, prescribed and over-the-counter, that you are taking. During your prenatal visit, discuss the medications your doctor may give you during labor and delivery. N Never be afraid to ask your doctor or caregiver questions about your health, the progress of the pregnancy, family problems, stressful situations, and recommendation for a pediatrician, if you do not have one. It is better to take all precautions and discuss any questions or concerns you may have during your office visits. It is a good idea to write down your questions before you visit the doctor. O Over-the-counter cough and cold remedies may contain alcohol or other ingredients that should be avoided during pregnancy. Ask your caregiver about prescription, herbs or over-the-counter medications that you are taking or may consider taking while pregnant.  P Physical activity during pregnancy can benefit both you and your baby by lessening  discomfort and fatigue, providing a sense of well-being, and increasing the likelihood of early recovery after delivery. Light to moderate exercise during pregnancy strengthens the belly (abdominal) and back muscles. This helps improve posture. Practicing yoga, walking, swimming, and cycling on a stationary bicycle are usually safe exercises for pregnant women. Avoid scuba diving, exercise at high altitudes (over 3000 feet), skiing, horseback riding, contact sports, etc. Always check with your doctor before beginning any kind of exercise, especially during pregnancy and especially if you did not exercise before getting pregnant. Q Queasiness, stomach upset and morning sickness are common during pregnancy. Eating a couple of crackers or dry  toast before getting out of bed. Foods that you normally love may make you feel sick to your stomach. You may need to substitute other nutritious foods. Eating 5 or 6 small meals a day instead of 3 large ones may make you feel better. Do not drink with your meals, drink between meals. Questions that you have should be written down and asked during your prenatal visits. R Read about and make plans to baby-proof your home. There are important tips for making your home a safer environment for your baby. Review the tips and make your home safer for you and your baby. Read food labels regarding calories, salt and fat content in the food. S Saunas, hot tubs, and steam rooms should be avoided while you are pregnant. Excessive high heat may be harmful during your pregnancy. Your caregiver will screen and examine you for sexually transmitted diseases and genetic disorders during your prenatal visits. Learn the signs of labor. Sexual relations while pregnant is safe unless there is a medical or pregnancy problem and your caregiver advises against it. T Traveling long distances should be avoided especially in the third trimester of your pregnancy. If you do have to travel out of state, be sure to take a copy of your medical records and medical insurance plan with you. You should not travel long distances without seeing your doctor first. Most airlines will not allow you to travel after 36 weeks of pregnancy. Toxoplasmosis is an infection caused by a parasite that can seriously harm an unborn baby. Avoid eating undercooked meat and handling cat litter. Be sure to wear gloves when gardening. Tingling of the hands and fingers is not unusual and is due to fluid retention. This will go away after the baby is born. U Womb (uterus) size increases during the first trimester. Your kidneys will begin to function more efficiently. This may cause you to feel the need to urinate more often. You may also leak urine when sneezing,  coughing or laughing. This is due to the growing uterus pressing against your bladder, which lies directly in front of and slightly under the uterus during the first few months of pregnancy. If you experience burning along with frequency of urination or bloody urine, be sure to tell your doctor. The size of your uterus in the third trimester may cause a problem with your balance. It is advisable to maintain good posture and avoid wearing high heels during this time. An ultrasound of your baby may be necessary during your pregnancy and is safe for you and your baby. V Vaccinations are an important concern for pregnant women. Get needed vaccines before pregnancy. Center for Disease Control ([employer]Ex[employer]) has clear guidelines for the use of vaccines during pregnancy. Review the list, be sure to discuss it with your doctor. Prenatal vitamins are helpful and healthy for you and the baby. Do  not take extra vitamins except what is recommended. Taking too much of certain vitamins can cause overdose problems. Continuous vomiting should be reported to your caregiver. Varicose veins may appear especially if there is a family history of varicose veins. They should subside after the delivery of the baby. Support hose helps if there is leg discomfort. W Being overweight or underweight during pregnancy may cause problems. Try to get within 15 pounds of your ideal weight before pregnancy. Remember, pregnancy is not a time to be dieting! Do not stop eating or start skipping meals as your weight increases. Both you and your baby need the calories and nutrition you receive from a healthy diet. Be sure to consult with your doctor about your diet. There is a formula and diet plan available depending on whether you are overweight or underweight. Your caregiver or nutritionist can help and advise you if necessary. X Avoid X-rays. If you must have dental work or diagnostic tests, tell your dentist or physician that you are pregnant so  that extra care can be taken. X-rays should only be taken when the risks of not taking them outweigh the risk of taking them. If needed, only the minimum amount of radiation should be used. When X-rays are necessary, protective lead shields should be used to cover areas of the body that are not being X-rayed. Y Your baby loves you. Breastfeeding your baby creates a loving and very close bond between the two of you. Give your baby a healthy environment to live in while you are pregnant. Infants and children require constant care and guidance. Their health and safety should be carefully watched at all times. After the baby is born, rest or take a nap when the baby is sleeping. Z Get your ZZZs. Be sure to get plenty of rest. Resting on your side as often as possible, especially on your left side is advised. It provides the best circulation to your baby and helps reduce swelling. Try taking a nap for 30 to 45 minutes in the afternoon when possible. After the baby is born rest or take a nap when the baby is sleeping. Try elevating your feet for that amount of time when possible. It helps the circulation in your legs and helps reduce swelling.  Most information courtesy of the CDC. Document Released: 08/27/2005 Document Revised: 11/19/2011 Document Reviewed: 05/11/2009 [facility] Patient Information 2013 [place], [place].

## 2012-07-22 ENCOUNTER — Telehealth: Payer: Self-pay | Admitting: Obstetrics and Gynecology

## 2012-07-22 NOTE — Telephone Encounter (Signed)
Tc to pt per telephone call. Pt told can take otc Tylenol(regular or xs). Pt voices understanding.

## 2012-07-30 NOTE — Progress Notes (Signed)
Pt rescheduled

## 2012-08-11 ENCOUNTER — Encounter: Payer: Self-pay | Admitting: Obstetrics and Gynecology

## 2012-08-11 ENCOUNTER — Ambulatory Visit (INDEPENDENT_AMBULATORY_CARE_PROVIDER_SITE_OTHER): Payer: BC Managed Care – PPO | Admitting: Obstetrics and Gynecology

## 2012-08-11 VITALS — BP 102/60 | Wt 151.0 lb

## 2012-08-11 DIAGNOSIS — Z9889 Other specified postprocedural states: Secondary | ICD-10-CM

## 2012-08-11 DIAGNOSIS — Z331 Pregnant state, incidental: Secondary | ICD-10-CM

## 2012-08-11 DIAGNOSIS — B009 Herpesviral infection, unspecified: Secondary | ICD-10-CM

## 2012-08-11 DIAGNOSIS — Z98891 History of uterine scar from previous surgery: Secondary | ICD-10-CM

## 2012-08-11 DIAGNOSIS — Z124 Encounter for screening for malignant neoplasm of cervix: Secondary | ICD-10-CM

## 2012-08-11 NOTE — Progress Notes (Signed)
Pt here for NOB exam. History reviewed Physical Examination: General appearance - alert, well appearing, and in no distress Mental status - alert, oriented to person, place, and time Mouth - mucous membranes moist, pharynx normal without lesions Neck - supple, no significant adenopathy Chest - clear to auscultation, no wheezes, rales or rhonchi, symmetric air entry Heart - normal rate and regular rhythm Abdomen - soft, nontender, nondistended, no masses or organomegaly Breasts - breasts appear normal, no suspicious masses, no skin or nipple changes or axillary nodes Pelvic - normal external genitalia, vulva, vagina, cervix, uterus and adnexa, WET MOUNT done - results: negative for pathogens, normal epithelial cells. Uterus about 13 week size Rectal - rectal exam not indicated Back exam - full range of motion, no tenderness, palpable spasm or pain on motion Musculoskeletal - no joint tenderness, deformity or swelling Extremities - peripheral pulses normal, no pedal edema, no clubbing or cyanosis Pap with cultures sent Wet prep negative Problem List updated Labs reviewed RT 4 weeks

## 2012-08-11 NOTE — Progress Notes (Signed)
NOB workup
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
## 2012-08-12 LAB — PAP IG, CT-NG, RFX HPV ASCU

## 2012-09-08 ENCOUNTER — Encounter: Payer: Self-pay | Admitting: Obstetrics and Gynecology

## 2012-09-08 ENCOUNTER — Ambulatory Visit (INDEPENDENT_AMBULATORY_CARE_PROVIDER_SITE_OTHER): Payer: BC Managed Care – PPO | Admitting: Obstetrics and Gynecology

## 2012-09-08 VITALS — BP 100/70 | Wt 151.0 lb

## 2012-09-08 DIAGNOSIS — Z3689 Encounter for other specified antenatal screening: Secondary | ICD-10-CM

## 2012-09-08 NOTE — Progress Notes (Signed)
Doing well.   Declines genetic screening Desires VBAC--reviewed R&B.  Will need consent and visit with MD as pregnancy progresses. Schedule [place] for anatomy in 3-4 weeks.

## 2012-09-08 NOTE — Progress Notes (Signed)
[redacted]w[redacted]d No concerns today

## 2012-09-10 HISTORY — DX: Maternal care for unspecified type scar from previous cesarean delivery: O34.219

## 2012-09-10 NOTE — L&D Delivery Note (Signed)
Delivery Note At 5:24 PM a viable female was delivered via VBAC, Spontaneous (Presentation: Left Occiput Anterior).  APGAR: , ; weight pending.   Placenta status: Intact, Spontaneous Shultz, 3VC.  No complications.  Cord pH: not collected  Anesthesia:   Episiotomy: None Lacerations: 2nd degree;Perineal Suture Repair: 3.0 vicryl Est. Blood Loss (mL): 200  Mom to postpartum.  Baby to skin to skin immediately after delivery.  Haroldine Laws 02/05/2013, 6:03 PM

## 2012-09-22 ENCOUNTER — Ambulatory Visit (INDEPENDENT_AMBULATORY_CARE_PROVIDER_SITE_OTHER): Payer: BC Managed Care – PPO | Admitting: Obstetrics and Gynecology

## 2012-09-22 ENCOUNTER — Ambulatory Visit (INDEPENDENT_AMBULATORY_CARE_PROVIDER_SITE_OTHER): Payer: BC Managed Care – PPO

## 2012-09-22 ENCOUNTER — Other Ambulatory Visit: Payer: BC Managed Care – PPO

## 2012-09-22 VITALS — BP 110/60 | Wt 151.0 lb

## 2012-09-22 DIAGNOSIS — Z331 Pregnant state, incidental: Secondary | ICD-10-CM

## 2012-09-22 DIAGNOSIS — Z23 Encounter for immunization: Secondary | ICD-10-CM

## 2012-09-22 DIAGNOSIS — Z3689 Encounter for other specified antenatal screening: Secondary | ICD-10-CM

## 2012-09-22 NOTE — Progress Notes (Signed)
[redacted]w[redacted]d Ultrasound: Single gestation, normal fluid, normal anatomy, and after 31 g (52nd percentile), cervix 3.21 cm VBAC consent form reviewed and signed. Flu shot reviewed.  Given today Return to office in 4 weeks. Dr. Stefano Gaul

## 2012-09-22 NOTE — Addendum Note (Signed)
Addended by: Tim Lair on: 09/22/2012 03:45 PM   Modules accepted: Orders

## 2012-09-23 LAB — US OB COMP + 14 WK

## 2012-10-20 ENCOUNTER — Encounter: Payer: BC Managed Care – PPO | Admitting: Obstetrics and Gynecology

## 2012-10-27 ENCOUNTER — Ambulatory Visit: Payer: BC Managed Care – PPO | Admitting: Obstetrics and Gynecology

## 2012-10-27 ENCOUNTER — Encounter: Payer: Self-pay | Admitting: Obstetrics and Gynecology

## 2012-10-27 VITALS — BP 112/60 | Wt 156.0 lb

## 2012-10-27 DIAGNOSIS — Z98891 History of uterine scar from previous surgery: Secondary | ICD-10-CM

## 2012-10-27 NOTE — Progress Notes (Signed)
[redacted]w[redacted]d No complaints Doing well 1 hr glu NV

## 2012-11-08 ENCOUNTER — Encounter (HOSPITAL_COMMUNITY): Payer: Self-pay | Admitting: *Deleted

## 2012-11-08 ENCOUNTER — Inpatient Hospital Stay (HOSPITAL_COMMUNITY)
Admission: AD | Admit: 2012-11-08 | Discharge: 2012-11-08 | Disposition: A | Discharge status: Home / Self Care | Payer: BC Managed Care – PPO | Source: Ambulatory Visit | Attending: Obstetrics and Gynecology | Admitting: Obstetrics and Gynecology

## 2012-11-08 DIAGNOSIS — N949 Unspecified condition associated with female genital organs and menstrual cycle: Secondary | ICD-10-CM | POA: Insufficient documentation

## 2012-11-08 DIAGNOSIS — O99891 Other specified diseases and conditions complicating pregnancy: Secondary | ICD-10-CM | POA: Insufficient documentation

## 2012-11-08 LAB — WET PREP, GENITAL: Yeast Wet Prep HPF POC: NONE SEEN

## 2012-11-08 NOTE — MAU Note (Signed)
Pt reports she was standing up and some fluid leakied out. Her underwear was wet. Denies pain or cramping.

## 2012-11-10 LAB — GC/CHLAMYDIA PROBE AMP: CT Probe RNA: NEGATIVE
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
## 2012-11-26 ENCOUNTER — Other Ambulatory Visit: Payer: Self-pay | Admitting: Obstetrics and Gynecology

## 2012-11-26 ENCOUNTER — Encounter: Payer: BC Managed Care – PPO | Admitting: Obstetrics and Gynecology

## 2012-11-27 LAB — GLUCOSE TOLERANCE, 1 HOUR (50G) W/O FASTING: Glucose, 1 Hour GTT: 108 mg/dL (ref 70–140)

## 2012-11-27 LAB — CBC
HCT: 34 % — ABNORMAL LOW (ref 36.0–46.0)
Hemoglobin: 11.6 g/dL — ABNORMAL LOW (ref 12.0–15.0)
MCH: 28.8 pg (ref 26.0–34.0)
RBC: 4.03 MIL/uL (ref 3.87–5.11)

## 2012-11-27 LAB — RPR

## 2012-12-31 ENCOUNTER — Inpatient Hospital Stay (HOSPITAL_COMMUNITY)
Admission: AD | Admit: 2012-12-31 | Discharge: 2012-12-31 | Disposition: A | Discharge status: Home / Self Care | Payer: BC Managed Care – PPO | Source: Ambulatory Visit | Attending: Obstetrics and Gynecology | Admitting: Obstetrics and Gynecology

## 2012-12-31 DIAGNOSIS — O2390 Unspecified genitourinary tract infection in pregnancy, unspecified trimester: Secondary | ICD-10-CM | POA: Insufficient documentation

## 2012-12-31 DIAGNOSIS — B3731 Acute candidiasis of vulva and vagina: Secondary | ICD-10-CM

## 2012-12-31 DIAGNOSIS — B373 Candidiasis of vulva and vagina: Secondary | ICD-10-CM | POA: Insufficient documentation

## 2012-12-31 DIAGNOSIS — O99891 Other specified diseases and conditions complicating pregnancy: Secondary | ICD-10-CM | POA: Insufficient documentation

## 2012-12-31 LAB — WET PREP, GENITAL

## 2012-12-31 LAB — URINALYSIS, ROUTINE W REFLEX MICROSCOPIC
Hgb urine dipstick: NEGATIVE
Nitrite: NEGATIVE
Specific Gravity, Urine: 1.015 (ref 1.005–1.030)
Urobilinogen, UA: 1 mg/dL (ref 0.0–1.0)
pH: 6 (ref 5.0–8.0)

## 2012-12-31 LAB — AMNISURE RUPTURE OF MEMBRANE (ROM) NOT AT ARMC: Amnisure ROM: NEGATIVE

## 2012-12-31 LAB — URINE MICROSCOPIC-ADD ON

## 2012-12-31 MED ORDER — TERCONAZOLE 0.4 % VA CREA: 1.0000 | TOPICAL_CREAM | Freq: Every day | VAGINAL | Status: DC

## 2012-12-31 NOTE — MAU Provider Note (Signed)
History   Donna [name] (aka "Salomon Fick") is a 26y.o. BF who presents unannounced for r/o ROM.  Pt called at 2112 c/o LOF down her leg after using BR around that time.  Instructed pt to do pad count, and if continued leakage, to call back.  States she didn't do, hasn't had further leakage, but her mom told her she should come in.  Denies VB.  Does report some cramping.  GFM.  Daily meds=Valtrex and PNV.  No fever or chills.  No resp or other GI c/o's.  Desires VBAC this pregnancy. .. Patient Active Problem List  Diagnosis  . Dysmenorrhea  . Human papilloma virus  . Papanicolaou smear of cervix with non-atypical endometrial cells  . CIN I (cervical intraepithelial neoplasia I)  . H/O cesarean section  . HSV-1 (herpes simplex virus 1) infection  . HSV-2 (herpes simplex virus 2) infection    CSN: [id number]  Arrival date and time: 12/31/12 2151   None     Chief Complaint  Patient presents with  . Rupture of Membranes   HPI  OB History   Grav Para Term Preterm Abortions TAB SAB Ect Mult Living   2 1 1       1      Obstetric Comments   PTL AT 25 WEEKS; MODIFIED BEDREST AND MEDS      Past Medical History  Diagnosis Date  . Complication of anesthesia     DELAYED RESPONSE TO EPI;  NECK PAIN  . Hypertension 2005    ON DEPO  . Abnormal Pap smear 2012    COLPO  . 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 Infection 2010    INCISION  . Headache     FREQUENT  . Shoulder fracture AGE 76    Past Surgical History  Procedure Laterality Date  . Breast surgery    . Cesarean section      Family History  Problem Relation Age of Onset  . Hypertension Mother   . Cancer Father     ORAL  . Asthma Son   . Eczema Son   . Early death Maternal Uncle 30    HIV  . Seizures Maternal Uncle   . Cancer Paternal Aunt     BREAST  . Hypertension Maternal Grandmother   . Tuberculosis Maternal Grandmother     History  Substance Use Topics  . Smoking status: Never Smoker   . Smokeless tobacco: Not  on file  . Alcohol Use: No    Allergies:  Allergies  Allergen Reactions  . Hydrocodone-Acetaminophen Itching and Swelling    Can take Percocet  . Ciprofloxacin Other (See Comments)    Pt does not remember what happened.    Prescriptions prior to admission  Medication Sig Dispense Refill  . Prenatal Vit-Fe Fumarate-FA (PRENATAL MULTIVITAMIN) TABS Take 1 tablet by mouth daily at 12 noon.      . valACYclovir (VALTREX) 500 MG tablet Take 500 mg by mouth 2 (two) times daily. Patient is taking twice daily for 5 days and then once daily starting 4/25.        ROS--see HPI Physical Exam   Blood pressure 120/71, pulse 105, temperature 98.5 F (36.9 C), temperature source Oral, resp. rate 18, last menstrual period 05/06/2012.  Physical Exam  Constitutional: She is oriented to person, place, and time. She appears well-developed and well-nourished. No distress.  HENT:  Head: Normocephalic and atraumatic.  Eyes: Pupils are equal, round, and reactive to light.  glasses  Cardiovascular: Normal rate.   Respiratory: Effort normal.  GI: Soft.  gravid  Genitourinary:  Bimanual only; cx: closed/long/-2, vtx Clumpy, yellowish d/c on glove--sent for wet prep  Musculoskeletal: She exhibits no edema.  Neurological: She is alert and oriented to person, place, and time.  Skin: Skin is warm and dry.   .. Results for orders placed during the hospital encounter of 12/31/12 (from the past 24 hour(s))  AMNISURE RUPTURE OF MEMBRANE (ROM)     Status: None   Collection Time    12/31/12 10:27 PM      Result Value Range   Amnisure ROM NEGATIVE    WET PREP, GENITAL     Status: Abnormal   Collection Time    12/31/12 10:27 PM      Result Value Range   Yeast Wet Prep HPF POC MANY (*) NONE SEEN   Trich, Wet Prep NONE SEEN  NONE SEEN   Clue Cells Wet Prep HPF POC NONE SEEN  NONE SEEN   WBC, Wet Prep HPF POC MANY (*) NONE SEEN   MAU Course  Procedures 1. amnisure 2. Wet prep 3. NST: 150, reactive,  no decels, moderate variability, rare ctx  Assessment and Plan  1. [redacted]w[redacted]d 2. Yeast vaginitis 3. Cat I FHT 4. Neg amnisure w/ no s/s of ROM 5. No s/s of PTL  1. D/c home w/ leakage & PTL precautions 2. Terzol 7 qhs x7d; Rx given 3. F/u 01/09/13 as previously scheduled, or prn  Emilo Gras H 12/31/2012, 10:30 PM

## 2012-12-31 NOTE — MAU Note (Signed)
Patient states around 8pm she felt some watery clear fluid running down her leg after using the bathroom. No vaginal bleeding. Good fetal movement.

## 2013-01-25 ENCOUNTER — Encounter (HOSPITAL_COMMUNITY): Payer: Self-pay | Admitting: *Deleted

## 2013-01-25 ENCOUNTER — Inpatient Hospital Stay (HOSPITAL_COMMUNITY)
Admission: AD | Admit: 2013-01-25 | Discharge: 2013-01-25 | Disposition: A | Discharge status: Home / Self Care | Payer: BC Managed Care – PPO | Source: Ambulatory Visit | Attending: Obstetrics and Gynecology | Admitting: Obstetrics and Gynecology

## 2013-01-25 DIAGNOSIS — O479 False labor, unspecified: Secondary | ICD-10-CM | POA: Insufficient documentation

## 2013-01-25 LAB — AMNISURE RUPTURE OF MEMBRANE (ROM) NOT AT ARMC: Amnisure ROM: NEGATIVE

## 2013-01-25 NOTE — MAU Note (Signed)
Pt reports she started having some leaking since yesterday. SHavign some more periods of wetness. Reports occasional mild contractions. Fetal movement reported.

## 2013-01-25 NOTE — Progress Notes (Signed)
S Lillard notified of amnisure results, FHR pattern and contraction pattern orders received

## 2013-01-28 ENCOUNTER — Inpatient Hospital Stay (HOSPITAL_COMMUNITY)
Admission: AD | Admit: 2013-01-28 | Discharge: 2013-01-28 | Disposition: A | Discharge status: Home / Self Care | Payer: BC Managed Care – PPO | Source: Ambulatory Visit | Attending: Obstetrics and Gynecology | Admitting: Obstetrics and Gynecology

## 2013-01-28 ENCOUNTER — Encounter (HOSPITAL_COMMUNITY): Payer: Self-pay | Admitting: *Deleted

## 2013-01-28 DIAGNOSIS — O212 Late vomiting of pregnancy: Secondary | ICD-10-CM | POA: Insufficient documentation

## 2013-01-28 LAB — COMPREHENSIVE METABOLIC PANEL
AST: 12 U/L (ref 0–37)
Albumin: 2.6 g/dL — ABNORMAL LOW (ref 3.5–5.2)
Alkaline Phosphatase: 106 U/L (ref 39–117)
BUN: 3 mg/dL — ABNORMAL LOW (ref 6–23)
Creatinine, Ser: 0.61 mg/dL (ref 0.50–1.10)
Potassium: 3.8 mEq/L (ref 3.5–5.1)
Total Protein: 5.8 g/dL — ABNORMAL LOW (ref 6.0–8.3)

## 2013-01-28 LAB — CBC WITH DIFFERENTIAL/PLATELET
Basophils Absolute: 0 10*3/uL (ref 0.0–0.1)
Basophils Relative: 0 % (ref 0–1)
Eosinophils Absolute: 0.1 10*3/uL (ref 0.0–0.7)
HCT: 32.4 % — ABNORMAL LOW (ref 36.0–46.0)
MCV: 83.1 fL (ref 78.0–100.0)
Monocytes Relative: 6 % (ref 3–12)
Neutrophils Relative %: 79 % — ABNORMAL HIGH (ref 43–77)
RBC: 3.9 MIL/uL (ref 3.87–5.11)
RDW: 14.2 % (ref 11.5–15.5)
WBC: 8.4 10*3/uL (ref 4.0–10.5)

## 2013-01-28 LAB — AMYLASE: Amylase: 95 U/L (ref 0–105)

## 2013-01-28 LAB — URINALYSIS, ROUTINE W REFLEX MICROSCOPIC
Nitrite: NEGATIVE
Specific Gravity, Urine: 1.015 (ref 1.005–1.030)
Urobilinogen, UA: 1 mg/dL (ref 0.0–1.0)

## 2013-01-28 LAB — URINE MICROSCOPIC-ADD ON

## 2013-01-28 LAB — LIPASE, BLOOD: Lipase: 35 U/L (ref 11–59)

## 2013-01-28 MED ORDER — ONDANSETRON 4 MG PO TBDP: 4.0000 mg | ORAL_TABLET | Freq: Three times a day (TID) | ORAL | Status: DC | PRN

## 2013-01-28 MED ORDER — ONDANSETRON HCL 4 MG/2ML IJ SOLN
4.0000 mg | Freq: Once | INTRAMUSCULAR | Status: AC
  Administered 2013-01-28: 4 mg via INTRAVENOUS
  Filled 2013-01-28: qty 2

## 2013-01-28 MED ORDER — PANTOPRAZOLE SODIUM 40 MG PO TBEC: 40.0000 mg | DELAYED_RELEASE_TABLET | Freq: Every day | ORAL | Status: DC

## 2013-01-28 MED ORDER — DEXTROSE IN LACTATED RINGERS 5 % IV SOLN
INTRAVENOUS | Status: DC
  Administered 2013-01-28: 21:00:00 via INTRAVENOUS

## 2013-01-28 MED ORDER — LACTATED RINGERS IV SOLN
INTRAVENOUS | Status: DC
  Administered 2013-01-28: 20:00:00 via INTRAVENOUS

## 2013-01-28 MED ORDER — PANTOPRAZOLE SODIUM 40 MG IV SOLR
40.0000 mg | Freq: Once | INTRAVENOUS | Status: AC
  Administered 2013-01-28: 40 mg via INTRAVENOUS
  Filled 2013-01-28: qty 40

## 2013-01-28 MED ORDER — PANTOPRAZOLE SODIUM 40 MG IV SOLR: 40.0000 mg | INTRAVENOUS | Status: DC

## 2013-01-28 MED ORDER — LACTATED RINGERS IV BOLUS (SEPSIS)
500.0000 mL | Freq: Once | INTRAVENOUS | Status: AC
  Administered 2013-01-28: 500 mL via INTRAVENOUS

## 2013-01-28 NOTE — MAU Note (Signed)
Initial exam and note by Haroldine Laws, CNM.  F/U assessment: Resting quietly--denies N/V.  Reports onset of vomiting after each intake of solid foods since Monday night, but no nausea.  Has been able to keep fluids down.  Has tried different foods, with same outcome.  No viral exposures, no hx reflux or gastroparesis.  1st bag LR almost completed.  Patient Active Problem List   Diagnosis Date Noted  . Dysmenorrhea 01/22/2012  . 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 Papanicolaou smear of cervix with non-atypical endometrial cells 01/22/2012  . CIN I (cervical intraepithelial neoplasia I) 01/22/2012  . H/O cesarean section 01/22/2012  . 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  Previous C/S due to FTD.  Plans VBAC No HSV lesions or prodrome.   Filed Vitals:   01/28/13 1730 01/28/13 1739  BP:  125/69  Pulse:  108  Temp:  99.4 F (37.4 C)  TempSrc:  Oral  Resp:  18  Height: 5\' 3"  (1.6 m) 5\' 5"  (1.651 m)  Weight: 161 lb 8 oz (73.256 kg) 161 lb 12.8 oz (73.392 kg)   Results for orders placed during the hospital encounter of 01/28/13 (from the past 24 hour(s))  URINALYSIS, ROUTINE W REFLEX MICROSCOPIC     Status: Abnormal   Collection Time    01/28/13  5:27 PM      Result Value Range   Color, Urine YELLOW  YELLOW   APPearance CLEAR  CLEAR   Specific Gravity, Urine 1.015  1.005 - 1.030   pH 6.5  5.0 - 8.0   Glucose, UA NEGATIVE  NEGATIVE mg/dL   Hgb urine dipstick NEGATIVE  NEGATIVE   Bilirubin Urine NEGATIVE  NEGATIVE   Ketones, ur >80 (*) NEGATIVE mg/dL   Protein, ur NEGATIVE  NEGATIVE mg/dL   Urobilinogen, UA 1.0  0.0 - 1.0 mg/dL   Nitrite NEGATIVE  NEGATIVE   Leukocytes, UA MODERATE (*) NEGATIVE  URINE MICROSCOPIC-ADD ON     Status: Abnormal   Collection Time    01/28/13  5:27 PM      Result Value Range   Squamous Epithelial / LPF FEW (*) RARE   WBC, UA 3-6  <3 WBC/hpf   RBC / HPF 0-2  <3 RBC/hpf    Urine to culture.  Chest clear Hear RRR without murmur Abd gravid, NT Pelvic--cervix posterior, 1 cm, 50%, vtx, -2. Ext WNL  FHR Category 1 UCs irregular, mild.  Assessment: IUP at 38 1/7 weeks ? Reflux   Plan: Protonix IV Zofran IV D5LR at 250 cc/hr. Will give meds, continue IV hydration--anticipate d/c home with Rx for Protonix and Zofran. Had appt scheduled 5/22--will have office call patient to r/s for next week.  Nigel Bridgeman, CNM 01/28/13 8:30pm  Addendum: Consulted with Dr. Su Hilt. CBC, diff, CMP, amylase, lipase drawn. 2nd bag IVF infused.  Patient feeling "fine".  No vomiting or nausea.  CBC, diff, and CMP are WNL (glucose 157, but has received 1 bag D5LR) Amylase/lipase pending.  Will D/C home.   Office will call and check on patient status tomorrow, and r/s ROB appt for next week. Patient to remain on liquid diet for 24-48 hours, and push po fluids. Rx Zofran and Protonix. Call with any worsening of sx or with any questions/concerns. I will f/u on amylase/lipase results.  Nigel Bridgeman CNM, [place] 01/28/13 10;05p

## 2013-01-28 NOTE — MAU Note (Signed)
Haroldine Laws CNM notified of pt.

## 2013-01-28 NOTE — MAU Provider Note (Signed)
History   26yo, G2P1001 at [redacted]w[redacted]d presented with emesis with eating since Monday.  She has been able to keep down fluid, she has not had nausea but when she eats she has an overwhelming urge to vomit.  No contact with others sick.  Last BM yesterday.  UCs have been mild but the intensity has increased today.  Denies VB, LOF, recent fever, resp c/o's, UTI or PIH s/s. GFM.  Chief Complaint  Patient presents with  . Contractions  . Emesis    OB History   Grav Para Term Preterm Abortions TAB SAB Ect Mult Living   2 1 1       1      Obstetric Comments   PTL AT 27 WEEKS; MODIFIED BEDREST AND MEDS      Past Medical History  Diagnosis Date  . Complication of anesthesia     DELAYED RESPONSE TO EPI;  NECK PAIN  . Hypertension 2005    ON DEPO  . Abnormal Pap smear 2012    COLPO  . 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 Infection 2010    INCISION  . Headache     FREQUENT  . Shoulder fracture AGE 89    Past Surgical History  Procedure Laterality Date  . Breast surgery    . Cesarean section      Family History  Problem Relation Age of Onset  . Hypertension Mother   . Cancer Father     ORAL  . Asthma Son   . Eczema Son   . Early death Maternal Uncle 30    HIV  . Seizures Maternal Uncle   . Cancer Paternal Aunt     BREAST  . Hypertension Maternal Grandmother   . Tuberculosis Maternal Grandmother     History  Substance Use Topics  . Smoking status: Never Smoker   . Smokeless tobacco: Not on file  . Alcohol Use: No    Allergies:  Allergies  Allergen Reactions  . Hydrocodone-Acetaminophen Itching and Swelling    Can take Percocet  . Ciprofloxacin Other (See Comments)    Pt does not remember what happened.    Prescriptions prior to admission  Medication Sig Dispense Refill  . Prenatal Vit-Fe Fumarate-FA (PRENATAL MULTIVITAMIN) TABS Take 1 tablet by mouth daily at 12 noon.      Marland Kitchen terconazole (TERAZOL 7) 0.4 % vaginal cream Place 1 applicator vaginally at bedtime.  45 g  0  .  valACYclovir (VALTREX) 500 MG tablet Take 500 mg by mouth 2 (two) times daily. Patient is taking twice daily for 5 days and then once daily starting 4/25.        ROS: see HPI above, all other symptoms are negative  Physical Exam   Blood pressure 125/69, pulse 108, temperature 99.4 F (37.4 C), temperature source Oral, resp. rate 18, height 5\' 5"  (1.651 m), weight 161 lb 12.8 oz (73.392 kg), last menstrual period 05/06/2012.  Chest: Clear Heart: RRR Abdomen: gravid, NT Extremities: WNL   Results for orders placed during the hospital encounter of 01/28/13 (from the past 24 hour(s))  URINALYSIS, ROUTINE W REFLEX MICROSCOPIC     Status: Abnormal   Collection Time    01/28/13  5:27 PM      Result Value Range   Color, Urine YELLOW  YELLOW   APPearance CLEAR  CLEAR   Specific Gravity, Urine 1.015  1.005 - 1.030   pH 6.5  5.0 - 8.0   Glucose, UA NEGATIVE  NEGATIVE  mg/dL   Hgb urine dipstick NEGATIVE  NEGATIVE   Bilirubin Urine NEGATIVE  NEGATIVE   Ketones, ur >80 (*) NEGATIVE mg/dL   Protein, ur NEGATIVE  NEGATIVE mg/dL   Urobilinogen, UA 1.0  0.0 - 1.0 mg/dL   Nitrite NEGATIVE  NEGATIVE   Leukocytes, UA MODERATE (*) NEGATIVE  URINE MICROSCOPIC-ADD ON     Status: Abnormal   Collection Time    01/28/13  5:27 PM      Result Value Range   Squamous Epithelial / LPF FEW (*) RARE   WBC, UA 3-6  <3 WBC/hpf   RBC / HPF 0-2  <3 RBC/hpf    ED Course  IUP at [redacted]w[redacted]d Vomiting with food Elevated ketones  LR IV bolus Reported off to on coming CNM Jackolyn Confer CNM, MSN 01/28/2013 6:22 PM

## 2013-02-05 ENCOUNTER — Encounter (HOSPITAL_COMMUNITY): Payer: Self-pay

## 2013-02-05 ENCOUNTER — Inpatient Hospital Stay (HOSPITAL_COMMUNITY)
Admission: AD | Admit: 2013-02-05 | Discharge: 2013-02-05 | Disposition: A | Discharge status: Home / Self Care | Payer: BC Managed Care – PPO | Source: Ambulatory Visit | Attending: Obstetrics and Gynecology | Admitting: Obstetrics and Gynecology

## 2013-02-05 ENCOUNTER — Encounter (HOSPITAL_COMMUNITY): Payer: Self-pay | Admitting: Anesthesiology

## 2013-02-05 ENCOUNTER — Inpatient Hospital Stay (HOSPITAL_COMMUNITY)
Admission: AD | Admit: 2013-02-05 | Discharge: 2013-02-07 | DRG: 372 | Disposition: A | Discharge status: Home / Self Care | Payer: BC Managed Care – PPO | Source: Ambulatory Visit | Attending: Obstetrics and Gynecology | Admitting: Obstetrics and Gynecology

## 2013-02-05 ENCOUNTER — Inpatient Hospital Stay (HOSPITAL_COMMUNITY): Payer: BC Managed Care – PPO | Admitting: Anesthesiology

## 2013-02-05 DIAGNOSIS — O98519 Other viral diseases complicating pregnancy, unspecified trimester: Secondary | ICD-10-CM | POA: Diagnosis present

## 2013-02-05 DIAGNOSIS — O99892 Other specified diseases and conditions complicating childbirth: Secondary | ICD-10-CM | POA: Diagnosis present

## 2013-02-05 DIAGNOSIS — Z22330 Carrier of Group B streptococcus: Secondary | ICD-10-CM

## 2013-02-05 DIAGNOSIS — O471 False labor at or after 37 completed weeks of gestation: Secondary | ICD-10-CM

## 2013-02-05 DIAGNOSIS — O479 False labor, unspecified: Secondary | ICD-10-CM | POA: Insufficient documentation

## 2013-02-05 DIAGNOSIS — O34219 Maternal care for unspecified type scar from previous cesarean delivery: Secondary | ICD-10-CM | POA: Diagnosis not present

## 2013-02-05 DIAGNOSIS — A6000 Herpesviral infection of urogenital system, unspecified: Secondary | ICD-10-CM | POA: Diagnosis present

## 2013-02-05 LAB — CBC
MCHC: 33.9 g/dL (ref 30.0–36.0)
Platelets: 182 10*3/uL (ref 150–400)
RDW: 14 % (ref 11.5–15.5)
WBC: 10.6 10*3/uL — ABNORMAL HIGH (ref 4.0–10.5)

## 2013-02-05 LAB — TYPE AND SCREEN
ABO/RH(D): O POS
Antibody Screen: NEGATIVE

## 2013-02-05 LAB — RPR: RPR Ser Ql: NONREACTIVE

## 2013-02-05 MED ORDER — ONDANSETRON HCL 4 MG PO TABS: 4.0000 mg | ORAL_TABLET | ORAL | Status: DC | PRN

## 2013-02-05 MED ORDER — LACTATED RINGERS IV SOLN
500.0000 mL | INTRAVENOUS | Status: DC | PRN
  Administered 2013-02-05: 1000 mL via INTRAVENOUS

## 2013-02-05 MED ORDER — PHENYLEPHRINE 40 MCG/ML (10ML) SYRINGE FOR IV PUSH (FOR BLOOD PRESSURE SUPPORT)
80.0000 ug | PREFILLED_SYRINGE | INTRAVENOUS | Status: DC | PRN
  Filled 2013-02-05: qty 2
  Filled 2013-02-05: qty 5

## 2013-02-05 MED ORDER — ZOLPIDEM TARTRATE 5 MG PO TABS: 5.0000 mg | ORAL_TABLET | Freq: Every evening | ORAL | Status: DC | PRN

## 2013-02-05 MED ORDER — MEDROXYPROGESTERONE ACETATE 150 MG/ML IM SUSP: 150.0000 mg | INTRAMUSCULAR | Status: DC | PRN

## 2013-02-05 MED ORDER — ONDANSETRON HCL 4 MG/2ML IJ SOLN: 4.0000 mg | INTRAMUSCULAR | Status: DC | PRN

## 2013-02-05 MED ORDER — SODIUM BICARBONATE 8.4 % IV SOLN
INTRAVENOUS | Status: DC | PRN
  Administered 2013-02-05: 5 mL via EPIDURAL

## 2013-02-05 MED ORDER — LACTATED RINGERS IV SOLN
INTRAVENOUS | Status: DC
  Administered 2013-02-05 (×2): via INTRAVENOUS

## 2013-02-05 MED ORDER — OXYTOCIN 40 UNITS IN LACTATED RINGERS INFUSION - SIMPLE MED
62.5000 mL/h | INTRAVENOUS | Status: DC
  Administered 2013-02-05: 999 mL/h via INTRAVENOUS
  Filled 2013-02-05: qty 1000

## 2013-02-05 MED ORDER — DIPHENHYDRAMINE HCL 25 MG PO CAPS: 25.0000 mg | ORAL_CAPSULE | Freq: Four times a day (QID) | ORAL | Status: DC | PRN

## 2013-02-05 MED ORDER — PRENATAL MULTIVITAMIN CH
1.0000 | ORAL_TABLET | Freq: Every day | ORAL | Status: DC
  Administered 2013-02-06 – 2013-02-07 (×2): 1 via ORAL
  Filled 2013-02-05 (×2): qty 1

## 2013-02-05 MED ORDER — OXYTOCIN BOLUS FROM INFUSION: 500.0000 mL | INTRAVENOUS | Status: DC

## 2013-02-05 MED ORDER — LACTATED RINGERS IV SOLN: 500.0000 mL | Freq: Once | INTRAVENOUS | Status: DC

## 2013-02-05 MED ORDER — SENNOSIDES-DOCUSATE SODIUM 8.6-50 MG PO TABS
2.0000 | ORAL_TABLET | Freq: Every day | ORAL | Status: DC
  Administered 2013-02-05 – 2013-02-06 (×2): 2 via ORAL

## 2013-02-05 MED ORDER — LIDOCAINE HCL (PF) 1 % IJ SOLN
30.0000 mL | INTRAMUSCULAR | Status: DC | PRN
  Filled 2013-02-05 (×2): qty 30

## 2013-02-05 MED ORDER — ONDANSETRON HCL 4 MG/2ML IJ SOLN: 4.0000 mg | Freq: Four times a day (QID) | INTRAMUSCULAR | Status: DC | PRN

## 2013-02-05 MED ORDER — DIPHENHYDRAMINE HCL 50 MG/ML IJ SOLN: 12.5000 mg | INTRAMUSCULAR | Status: DC | PRN

## 2013-02-05 MED ORDER — EPHEDRINE 5 MG/ML INJ
10.0000 mg | INTRAVENOUS | Status: DC | PRN
  Filled 2013-02-05: qty 4
  Filled 2013-02-05: qty 2

## 2013-02-05 MED ORDER — IBUPROFEN 600 MG PO TABS: 600.0000 mg | ORAL_TABLET | Freq: Four times a day (QID) | ORAL | Status: DC | PRN

## 2013-02-05 MED ORDER — FENTANYL 2.5 MCG/ML BUPIVACAINE 1/10 % EPIDURAL INFUSION (WH - ANES)
14.0000 mL/h | INTRAMUSCULAR | Status: DC | PRN
  Administered 2013-02-05: 14 mL/h via EPIDURAL
  Filled 2013-02-05: qty 125

## 2013-02-05 MED ORDER — IBUPROFEN 600 MG PO TABS
600.0000 mg | ORAL_TABLET | Freq: Four times a day (QID) | ORAL | Status: DC
  Administered 2013-02-05 – 2013-02-07 (×8): 600 mg via ORAL
  Filled 2013-02-05 (×8): qty 1

## 2013-02-05 MED ORDER — BENZOCAINE-MENTHOL 20-0.5 % EX AERO
1.0000 "application " | INHALATION_SPRAY | CUTANEOUS | Status: DC | PRN
  Administered 2013-02-05: 1 via TOPICAL
  Filled 2013-02-05: qty 56

## 2013-02-05 MED ORDER — SIMETHICONE 80 MG PO CHEW: 80.0000 mg | CHEWABLE_TABLET | ORAL | Status: DC | PRN

## 2013-02-05 MED ORDER — EPHEDRINE 5 MG/ML INJ
10.0000 mg | INTRAVENOUS | Status: DC | PRN
  Filled 2013-02-05: qty 2

## 2013-02-05 MED ORDER — SODIUM CHLORIDE 0.9 % IV SOLN
2.0000 g | Freq: Once | INTRAVENOUS | Status: AC
  Administered 2013-02-05: 2 g via INTRAVENOUS
  Filled 2013-02-05: qty 2000

## 2013-02-05 MED ORDER — PHENYLEPHRINE 40 MCG/ML (10ML) SYRINGE FOR IV PUSH (FOR BLOOD PRESSURE SUPPORT)
80.0000 ug | PREFILLED_SYRINGE | INTRAVENOUS | Status: DC | PRN
  Filled 2013-02-05: qty 2

## 2013-02-05 MED ORDER — CITRIC ACID-SODIUM CITRATE 334-500 MG/5ML PO SOLN: 30.0000 mL | ORAL | Status: DC | PRN

## 2013-02-05 MED ORDER — WITCH HAZEL-GLYCERIN EX PADS: 1.0000 "application " | MEDICATED_PAD | CUTANEOUS | Status: DC | PRN

## 2013-02-05 MED ORDER — DIBUCAINE 1 % RE OINT: 1.0000 "application " | TOPICAL_OINTMENT | RECTAL | Status: DC | PRN

## 2013-02-05 MED ORDER — TETANUS-DIPHTH-ACELL PERTUSSIS 5-2.5-18.5 LF-MCG/0.5 IM SUSP: 0.5000 mL | Freq: Once | INTRAMUSCULAR | Status: DC

## 2013-02-05 MED ORDER — LANOLIN HYDROUS EX OINT: TOPICAL_OINTMENT | CUTANEOUS | Status: DC | PRN

## 2013-02-05 NOTE — Anesthesia Procedure Notes (Signed)

## 2013-02-05 NOTE — MAU Note (Signed)
Patient states having contractions since last pm. Became more intense around 2AM

## 2013-02-05 NOTE — MAU Note (Signed)
ontractions 

## 2013-02-05 NOTE — MAU Provider Note (Signed)
History   27 yo, G2P1001 at [redacted]w[redacted]d presents to MAU with c/o UCs that started last night and became worse around 2am.  Pt states they are about 5 min apart. Denies VB, LOF, recent fever, resp or GI c/o's, UTI or PIH s/s. GFM.   Chief Complaint  Patient presents with  . Labor Eval    OB History   Grav Para Term Preterm Abortions TAB SAB Ect Mult Living   2 1 1       1      Obstetric Comments   PTL AT 25 WEEKS; MODIFIED BEDREST AND MEDS      Past Medical History  Diagnosis Date  . Complication of anesthesia     DELAYED RESPONSE TO EPI;  NECK PAIN  . Hypertension 2005    ON DEPO  . Abnormal Pap smear 2012    COLPO  . 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 Infection 2010    INCISION  . Headache(784.0)     FREQUENT  . Shoulder fracture AGE 19    Past Surgical History  Procedure Laterality Date  . Breast surgery    . Cesarean section      Family History  Problem Relation Age of Onset  . Hypertension Mother   . Cancer Father     ORAL  . Asthma Son   . Eczema Son   . Early death Maternal Uncle 30    HIV  . Seizures Maternal Uncle   . Cancer Paternal Aunt     BREAST  . Hypertension Maternal Grandmother   . Tuberculosis Maternal Grandmother     History  Substance Use Topics  . Smoking status: Never Smoker   . Smokeless tobacco: Not on file  . Alcohol Use: No    Allergies:  Allergies  Allergen Reactions  . Hydrocodone-Acetaminophen Itching and Swelling    Can take Percocet  . Ciprofloxacin Other (See Comments)    Pt does not remember what happened.    Prescriptions prior to admission  Medication Sig Dispense Refill  . ondansetron (ZOFRAN ODT) 4 MG disintegrating tablet Take 1 tablet (4 mg total) by mouth every 8 (eight) hours as needed for nausea.  20 tablet  0  . pantoprazole (PROTONIX) 40 MG tablet Take 1 tablet (40 mg total) by mouth daily.  30 tablet  2  . Prenatal Vit-Fe Fumarate-FA (PRENATAL MULTIVITAMIN) TABS Take 1 tablet by mouth daily at 12 noon.      Marland Kitchen  terconazole (TERAZOL 7) 0.4 % vaginal cream Place 1 applicator vaginally at bedtime.  45 g  0  . valACYclovir (VALTREX) 500 MG tablet Take 500 mg by mouth 2 (two) times daily. Patient is taking twice daily for 5 days and then once daily starting 4/25.        ROS: see HPI above, all other systems are negative  Physical Exam   Blood pressure 116/67, pulse 90, temperature 98.4 F (36.9 C), temperature source Oral, resp. rate 18, height 5\' 5"  (1.651 m), weight 165 lb (74.844 kg), last menstrual period 05/06/2012, SpO2 100.00%.  Chest: Clear Heart: RRR Abdomen: gravid, NT Extremities: WNL  Pelvic: Dilation: 3 Effacement (%): 80 Cervical Position: Posterior Station: -2 Presentation: Vertex Exam by:: J Donna [name] CNM  FHT: Reactive NST UCs: Q 8 min  ED Course  IUP at [redacted]w[redacted]d Early labor No cervical change  D/c home with labor precautions F/u in the office on 6/5 or call if UCs get more intense or closer together  Haroldine Laws CNM, MSN 02/05/2013 7:26 AM

## 2013-02-05 NOTE — Progress Notes (Signed)
  Subjective: Pt just got epidural, starting to get relief.  HSV + with outbreaks during pregnancy.  Objective: BP 122/71  Pulse 79  Temp(Src) 98 F (36.7 C) (Oral)  Resp 20  Ht 5\' 5"  (1.651 m)  Wt 164 lb 6 oz (74.56 kg)  BMI 27.35 kg/m2  SpO2 100%  LMP 05/06/2012      FHT:  Cat II, on left side UC:   regular, every 2-3 minutes  SSE:  No lesions note on vaginal walls or visible cx.  Assessment / Plan:  Labor: Active labor, TOLAC Preeclampsia: no s/s Fetal Wellbeing: Pain Control: epidural I/D: GBS prophylaxis for rapid labor initiated with admision Anticipated MOD: VBAC  SROM prior to admission - clear fluid  Donna [name] 02/05/2013, 2:09 PM

## 2013-02-05 NOTE — Progress Notes (Signed)
Patient resting comfortably, agrees to wait for [facility]. Update given to Hiliary.

## 2013-02-05 NOTE — MAU Note (Signed)
Pt is in MAU complaining of contractions every 5 mins. States they are more intense than earlier today and states seeing mucous plug. Denies vaginal bleeding or LOF, complains of decreased fetal movement

## 2013-02-05 NOTE — MAU Provider Note (Signed)
History   Donna [name] is a 26y.o. BF at [redacted]w[redacted]d who presents for labor check.  Pt called around 2300, reporting ctxs q 4-6 min for one hr; after discussion on phone, pt decided to continue to observe at home.  Cx was "2-3cm/50%" at office by [facility] exam per pt recall yesterday, and membranes swept.  No LOF or VB.  Has drunk very little overnight.  States ctxs are about the same frequency, but kept waking her up.  Reports frequency about the same as when she called earlier tonight.  No UTI or PIH s/s.  Reports brown d/c w/ wiping at home before coming.   .. Patient Active Problem List   Diagnosis Date Noted  . Dysmenorrhea 01/22/2012  . 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 Papanicolaou smear of cervix with non-atypical endometrial cells 01/22/2012  . CIN I (cervical intraepithelial neoplasia I) 01/22/2012  . H/O cesarean section 01/22/2012  . 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   CSN: [id number]  Arrival date and time: 02/05/13 0418   None     Chief Complaint  Patient presents with  . Labor Eval   HPI  OB History   Grav Para Term Preterm Abortions TAB SAB Ect Mult Living   2 1 1       1      Obstetric Comments   PTL AT 84 WEEKS; MODIFIED BEDREST AND MEDS      Past Medical History  Diagnosis Date  . Complication of anesthesia     DELAYED RESPONSE TO EPI;  NECK PAIN  . Hypertension 2005    ON DEPO  . Abnormal Pap smear 2012    COLPO  . 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 Infection 2010    INCISION  . Headache(784.0)     FREQUENT  . Shoulder fracture AGE 33    Past Surgical History  Procedure Laterality Date  . Breast surgery    . Cesarean section      Family History  Problem Relation Age of Onset  . Hypertension Mother   . Cancer Father     ORAL  . Asthma Son   . Eczema Son   . Early death Maternal Uncle 30    HIV  . Seizures Maternal Uncle   . Cancer Paternal Aunt     BREAST  . Hypertension  Maternal Grandmother   . Tuberculosis Maternal Grandmother     History  Substance Use Topics  . Smoking status: Never Smoker   . Smokeless tobacco: Not on file  . Alcohol Use: No    Allergies:  Allergies  Allergen Reactions  . Hydrocodone-Acetaminophen Itching and Swelling    Can take Percocet  . Ciprofloxacin Other (See Comments)    Pt does not remember what happened.    Prescriptions prior to admission  Medication Sig Dispense Refill  . ondansetron (ZOFRAN ODT) 4 MG disintegrating tablet Take 1 tablet (4 mg total) by mouth every 8 (eight) hours as needed for nausea.  20 tablet  0  . pantoprazole (PROTONIX) 40 MG tablet Take 1 tablet (40 mg total) by mouth daily.  30 tablet  2  . Prenatal Vit-Fe Fumarate-FA (PRENATAL MULTIVITAMIN) TABS Take 1 tablet by mouth daily at 12 noon.      Marland Kitchen terconazole (TERAZOL 7) 0.4 % vaginal cream Place 1 applicator vaginally at bedtime.  45 g  0  . valACYclovir (VALTREX) 500 MG tablet Take 500 mg  by mouth 2 (two) times daily. Patient is taking twice daily for 5 days and then once daily starting 4/25.        ROS--see HPI Physical Exam   Blood pressure 116/67, pulse 90, temperature 98.4 F (36.9 C), temperature source Oral, resp. rate 18, height 5\' 5"  (1.651 m), weight 165 lb (74.844 kg), last menstrual period 05/06/2012, SpO2 100.00%.  Physical Exam  Constitutional: She is oriented to person, place, and time. She appears well-developed and well-nourished. No distress.  HENT:  Head: Normocephalic and atraumatic.  Eyes: Pupils are equal, round, and reactive to light.  Cardiovascular: Normal rate.   Respiratory: Effort normal.  GI: Soft.  gravid  Genitourinary:  Cx: 3/80/-2, posterior, membranes slightly bulging  Musculoskeletal: She exhibits no edema.  Neurological: She is alert and oriented to person, place, and time. She has normal reflexes.  Skin: Skin is warm and dry.  Psychiatric: She has a normal mood and affect. Her behavior is  normal. Judgment and thought content normal.    MAU Course  Procedures 1. NST: 140, reactive, no decels, mod variability; TOCO: UC's q 2-7 min  Assessment and Plan  1. [redacted]w[redacted]d 2. Threatened labor at term 3. Cat I FHT 4. Previous c/s 5. H/o HSV  1. Will recheck cx in 2-3 hrs for cervical change, if no change, d/c home. 2. Offered pt therapeutic rest, and pt declined 3. Po push fluid  Jacqui Headen H 02/05/2013, 7:37 AM

## 2013-02-05 NOTE — H&P (Signed)
Donna [name] is a 27 y.o. female, G2P1001 at [redacted]w[redacted]d, presenting with strong UCs.  Patient Active Problem List   Diagnosis Date Noted  . Dysmenorrhea 01/22/2012  . 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 Papanicolaou smear of cervix with non-atypical endometrial cells 01/22/2012  . CIN I (cervical intraepithelial neoplasia I) 01/22/2012  . H/O cesarean section 01/22/2012  . 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   History of present pregnancy: Patient entered care at 6 weeks.   EDC of 02/10/13 was established by LMP.   Anatomy scan:  19 weeks, with normal findings and an posterior placenta.   Additional [place] evaluations:  none.   Significant prenatal events:  none   Last evaluation:  02/04/13 at [redacted]w[redacted]d  OB History   Grav Para Term Preterm Abortions TAB SAB Ect Mult Living   2 1 1       1      Obstetric Comments   PTL AT 72 WEEKS; MODIFIED BEDREST AND MEDS     Past Medical History  Diagnosis Date  . Complication of anesthesia     DELAYED RESPONSE TO EPI;  NECK PAIN  . Hypertension 2005    ON DEPO  . Abnormal Pap smear 2012    COLPO  . 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 Infection 2010    INCISION  . Headache(784.0)     FREQUENT  . Shoulder fracture AGE 26   Past Surgical History  Procedure Laterality Date  . Breast surgery    . Cesarean section     Family History: family history includes Asthma in her son; Cancer in her father and paternal aunt; Early death (age of onset: 18) in her maternal uncle; Eczema in her son; Hypertension in her maternal grandmother and mother; Seizures in her maternal uncle; and Tuberculosis in her maternal grandmother. Social History:  reports that she has never smoked. She does not have any smokeless tobacco history on file. She reports that she does not drink alcohol or use illicit drugs.   Prenatal Transfer Tool  Maternal Diabetes: No Genetic Screening: Declined Maternal  Ultrasounds/Referrals: Normal Fetal Ultrasounds or other Referrals:  None Maternal Substance Abuse:  No Significant Maternal Medications:  Meds include: Other: Valtrex Significant Maternal Lab Results: Lab values include: Group B Strep positive    ROS: see HPI above, all other systems are negative  Allergies  Allergen Reactions  . Hydrocodone-Acetaminophen Itching and Swelling    Can take Percocet  . Ciprofloxacin Itching and Swelling    Pt does not remember what happened.     Dilation: 7 Effacement (%): 90 Station: -2 Exam by:: j. Vermon Grays cnm Blood pressure 125/64, pulse 83, temperature 98 F (36.7 C), temperature source Oral, resp. rate 18, height 5\' 5"  (1.651 m), weight 164 lb 6 oz (74.56 kg), last menstrual period 05/06/2012.  Chest clear Heart RRR without murmur Abd gravid, NT Pelvic: see above, likely SROM Ext: WNL  FHR: Cat I UCs:  1.5 - 4 min  Prenatal labs: ABO, Rh: O/POS/-- (10/11 1129) Antibody: NEG (10/11 1129) Rubella:   Immune RPR: NON REAC (03/19 1237)  HBsAg: NEGATIVE (10/11 1129)  HIV: NON REACTIVE (10/11 1129)  GBS: Positive (05/08 0000) Sickle cell/Hgb electrophoresis:  n/a Pap:  08/11/12 WNL GC:  neg Chlamydia:  neg Genetic screenings:  Declines Glucola:  108 Other:  n/a    Assessment/Plan: IUP at [redacted]w[redacted]d Active labor TOLAC GBS pos HSV pos  Admit to BS per consult with Dr. Stefano Gaul as attending MD Routine [facility] orders GBS prophylaxis per protocol SSE for HSV lesions  Rowan Blase, MSN 02/05/2013, 12:36 PM

## 2013-02-05 NOTE — Progress Notes (Signed)
And Haroldine Laws CNM notified of pt VE

## 2013-02-05 NOTE — Progress Notes (Signed)
Jennifer oxley cnm notified of patients and c/o ctx q58m and intense. Will come to [facility] to see patient

## 2013-02-06 LAB — CBC
HCT: 34.7 % — ABNORMAL LOW (ref 36.0–46.0)
Platelets: 186 10*3/uL (ref 150–400)
RDW: 14.2 % (ref 11.5–15.5)
WBC: 15.9 10*3/uL — ABNORMAL HIGH (ref 4.0–10.5)

## 2013-02-06 MED ORDER — OXYCODONE-ACETAMINOPHEN 5-325 MG PO TABS
1.0000 | ORAL_TABLET | ORAL | Status: DC | PRN
  Administered 2013-02-06: 1 via ORAL
  Filled 2013-02-06 (×2): qty 1

## 2013-02-06 NOTE — Progress Notes (Signed)
Post Partum Day 1:S/P VBAC, 2nd degree  Subjective: Patient up ad lib, denies syncope or dizziness. Feeding:  Breast--feedings going slowly at present, baby very sleepy Contraceptive plan:   Nexplanon (previous user)  Objective: Blood pressure 96/57, pulse 80, temperature 98.4 F (36.9 C), temperature source Oral, resp. rate 18, height 5\' 5"  (1.651 m), weight 164 lb 6 oz (74.56 kg), last menstrual period 05/06/2012, SpO2 96.00%, unknown if currently breastfeeding.  Physical Exam:  General: alert Lochia: appropriate Uterine Fundus: firm Incision: healing well DVT Evaluation: No evidence of DVT seen on physical exam. Negative Homan's sign.   Recent Labs  02/05/13 1258 02/06/13 0620  HGB 12.1 11.5*  HCT 35.7* 34.7*    Assessment/Plan: S/P Vaginal delivery day 1 Continue current care Plan for discharge tomorrow   LOS: 1 day   Donna [name] 02/06/2013, 7:40 AM

## 2013-02-06 NOTE — Anesthesia Preprocedure Evaluation (Signed)

## 2013-02-07 DIAGNOSIS — O34219 Maternal care for unspecified type scar from previous cesarean delivery: Secondary | ICD-10-CM | POA: Diagnosis not present

## 2013-02-07 MED ORDER — IBUPROFEN 600 MG PO TABS: 600.0000 mg | ORAL_TABLET | Freq: Four times a day (QID) | ORAL | Status: DC | PRN

## 2013-02-07 MED ORDER — OXYCODONE-ACETAMINOPHEN 5-325 MG PO TABS: 1.0000 | ORAL_TABLET | ORAL | Status: DC | PRN

## 2013-02-07 MED ORDER — TETANUS-DIPHTH-ACELL PERTUSSIS 5-2.5-18.5 LF-MCG/0.5 IM SUSP
0.5000 mL | Freq: Once | INTRAMUSCULAR | Status: AC
  Administered 2013-02-07: 0.5 mL via INTRAMUSCULAR
  Filled 2013-02-07: qty 0.5

## 2013-02-07 MED ORDER — TETANUS-DIPHTH-ACELL PERTUSSIS 5-2.5-18.5 LF-MCG/0.5 IM SUSP: 0.5000 mL | Freq: Once | INTRAMUSCULAR | Status: DC

## 2013-02-07 NOTE — Anesthesia Postprocedure Evaluation (Signed)
Anesthesia Post Note  Patient: Donna [name]  Procedure(s) Performed: * No procedures listed *  Anesthesia type: Epidural  Patient location: Mother/Baby  Post pain: Pain level controlled  Post assessment: Post-op Vital signs reviewed  Last Vitals:  Filed Vitals:   02/07/13 0509  BP: 105/69  Pulse: 79  Temp: 36.5 C  Resp: 18    Post vital signs: Reviewed  Level of consciousness:alert  Complications: No apparent anesthesia complications

## 2013-02-07 NOTE — Discharge Summary (Signed)
  Vaginal Delivery Discharge Summary  SAMIKSHA PELLICANO  DOB:    1986/02/21 MRN:    [id number] CSN:    [id number]  Date of admission:                  02/05/13  Date of discharge:                   02/08/13  Procedures this admission:  Date of Delivery: 02/06/13  Newborn Data:  Live born female  Birth Weight: 6 lb 13.7 oz (3110 g) APGAR: 8, 9  Home with mother.   History of Present Illness:  Ms. SUSANNA BENGE is a 27 y.o. female, G2P2002, who presents at [redacted]w[redacted]d weeks gestation. The patient has been followed at the [facility] division of [facility]. She was admitted onset of labor. Her pregnancy has been complicated by:   Patient Active Problem List   Diagnosis Date Noted  . VBAC (vaginal birth after Cesarean) 02/07/2013  . Dysmenorrhea 01/22/2012  . 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 Papanicolaou smear of cervix with non-atypical endometrial cells 01/22/2012  . CIN I (cervical intraepithelial neoplasia I) 01/22/2012  . H/O cesarean section 01/22/2012  . 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  Hospital course:  The patient was admitted for labor.   Her labor was not complicated. She proceeded to have a VBAC delivery of a healthy infant by Haroldine Laws, CNM, with epidural analgesia and a 2nd degree laceration.  Her delivery was not complicated. Her postpartum course was not complicated. She was discharged to home on postpartum day 2 doing well.  Feeding:  breast  Contraception:  Nexplanon  Discharge hemoglobin:  Hemoglobin  Date Value Range Status  02/06/2013 11.5* 12.0 - 15.0 g/dL Final     HCT  Date Value Range Status  02/06/2013 34.7* 36.0 - 46.0 % Final    Discharge Physical Exam:   General: alert Lochia: appropriate Uterine Fundus: firm Incision: healing well DVT Evaluation: No evidence of DVT seen on physical  exam. Negative Homan's sign.  Intrapartum Procedures: VBAC Postpartum Procedures: none Complications-Operative and Postpartum: 2nd degree perineal laceration  Discharge Diagnoses: Term Pregnancy-delivered and VBAC  Discharge Information:  Activity:           Per [facility] handout Diet:                routine Medications: Ibuprofen and Percocet Condition:      stable Instructions:  refer to practice specific booklet Discharge to: home  Follow-up Information   Follow up with [facility]. Schedule an appointment as soon as possible for a visit in 5 weeks. (Call for any questions or concerns.)    Contact information:   [address] [place] [address] [phone]       Nigel Bridgeman 02/07/2013

## 2013-02-07 NOTE — Lactation Note (Signed)
This note was copied from the chart of Girl Maryse Brierley. Lactation Consultation Note  Patient Name: Girl Jaelah Hauth [id number] Date: 02/07/2013 Reason for consult: Follow-up assessment   Maternal Data    Feeding Feeding Type: Breast Milk Feeding method: Breast  LATCH Score/Interventions                      Lactation Tools Discussed/Used     Consult Status Consult Status: Complete  Mom reports that baby is latching well and she has no questions at present, To call prn.  Pamelia Hoit 02/07/2013, 8:36 AM

## 2014-07-12 ENCOUNTER — Encounter (HOSPITAL_COMMUNITY): Payer: Self-pay

## 2015-07-12 LAB — OB RESULTS CONSOLE RUBELLA ANTIBODY, IGM: Rubella: IMMUNE

## 2015-07-12 LAB — OB RESULTS CONSOLE HEPATITIS B SURFACE ANTIGEN: Hepatitis B Surface Ag: NEGATIVE

## 2015-07-12 LAB — OB RESULTS CONSOLE ABO/RH: RH TYPE: POSITIVE

## 2015-07-12 LAB — OB RESULTS CONSOLE HIV ANTIBODY (ROUTINE TESTING): HIV: NONREACTIVE

## 2015-07-12 LAB — OB RESULTS CONSOLE GC/CHLAMYDIA
Chlamydia: NEGATIVE
Gonorrhea: NEGATIVE

## 2015-07-12 LAB — OB RESULTS CONSOLE RPR: RPR: NONREACTIVE

## 2015-07-12 LAB — OB RESULTS CONSOLE ANTIBODY SCREEN: Antibody Screen: NEGATIVE

## 2016-01-09 LAB — OB RESULTS CONSOLE GBS: STREP GROUP B AG: POSITIVE

## 2016-02-24 ENCOUNTER — Other Ambulatory Visit: Payer: Self-pay | Admitting: Obstetrics and Gynecology

## 2016-02-25 NOTE — H&P (Signed)
Donna [name] is a 30 y.o. female, G3P2002 at 39.6 weeks, presenting for IOL due to polyhydramnios. Endorses FM & mild ctxs. Denies VB, LOF or recent outbreak/prodrome. Last HSV outbreak @ 35 wks. Cervix was 2 cm in office on 02/24/16.   Patient Active Problem List   Diagnosis Date Noted  . Polyhydramnios 02/26/2016  . VBAC (vaginal birth after Cesarean) 02/07/2013  . 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 Other abnormal Papanicolaou smear of cervix and cervical HPV(795.09) 01/22/2012  . CIN I (cervical intraepithelial neoplasia I) 01/22/2012  . H/O cesarean section 01/22/2012  . 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   EDC of 02/27/16 was established by LMP.   Anatomy scan: 20.2 weeks, with normal findings and a posterior placenta.   Additional US evaluations: 10.0 wks (FHTs): TA images. Anteverted Uterus. Singleton Intrauterine Pregnancy = 3440w2d. Ovaries and Adnexas unremarkable. 31.2 wks (Growth): Singleton Pregnancy. Vertex presentation. Cvx measures 4.0 cm. Posterior placenta. AFI = 25 cm, 97th% - Polyhydramnios. EFW = 1880 g, 69th%. Ovaries/Adnexas unremarkable. 32.0 wks: Singleton Pregnancy. Breech presentation. Posterior fundal placenta. Fluid is high - AFI = 34.2 cm > 95th%. Cvx long and closed. BPP 8/8 in 15 minutes. Adnexas/Ovaries unremarkable. 33.3 wks (Growth): Singleton Pregnancy. Vertex presentation. Limited views of cervix measures 4.4 cm. Posterior placenta. AFI = 32.9 cm = > 98th% - Polyhydramnios. 34.3 wks (BPP): FHT's 136 bpm. Singleton preg.  Vertex pres. Cx meas = 4.4 cm. Posterior placenta. AFI= 97th%tile. Polyhydramnios @ 27.0 cm. BPP 8/8 in 5 mins. Adnexas unremarkable. 35.2 wks (BPP, growth): Singleton pregnancy. Vertex presentation. Cervix not seen per protocol. Posterior placenta. AFI 29.2 cm. >90th%. Polyhydraminos. EFW = 6lb 4oz  (2831g) 77th% BPP 8/8 in 10 minutes. Adnexas unremarkable. 36.2 wks (Polyhydramnios): Singleton Pregnancy. Vertex presentation. Posterior placenta. AFI is polyhydramanios - 97th%. BPP = 6/8. Cvx not measured - per protocol. Adnexas unremarkable. 37.3 wks (BPP): Singleton Pregnancy. Vertex presentation. Cvx not seen per protocol. Posterior placenta. AFI = 26.2 cm, 97th% - Polyhydramnios. BPP 8/8 in 15 min. Adnexas unremarkable. 38.2 wks (Polyhydramnios): Singleton pregnancy. Vertex presentation. Posterior placenta. AFI is increased ->95th% BPP 8/8 in 1 minute. Cervix not measured per protocol. Adnexas are unremarkable. 39.4 wks (Polyhydramnios/BPP): Singleton Pregnancy in vertex presentation. BPP 8/8 in 10 minutes. AFI - 24.5 cm c/w > 95%. Posterior fundal placenta.   Significant prenatal events: Planned pregnancy. Normal pregnancy discomforts. Yeast infection. PTCs @ 28.5 wks (fFN neg, treated w/ IVFs and Procardia). Thrombectomy per Gen Surgery @ 33 wks. TWG 10.7 lbs.      Last evaluation: Office by [employer] @ 39.4 wks. BP 100/60. FHR 138 bpm. Cvx 2 cm.  OB History    Gravida Para Term Preterm AB TAB SAB Ectopic Multiple Living   3 2 2       2       Obstetric Comments   PTL AT 5125 WEEKS; MODIFIED BEDREST AND MEDS    Primary c-section on 05/28/2009 @ 38.5 wks, female infant, birthwt 7 lbs Successful VBAC on 02/05/2013 @ 39.2 wks, female infant, birthwt 6+13 Past Medical History  Diagnosis Date  . Complication of anesthesia     DELAYED RESPONSE TO EPI;  NECK PAIN  . Hypertension 2005    ON DEPO  . Abnormal Pap smear 2012    COLPO  . 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 Infection  2010    INCISION  . Headache(784.0)     FREQUENT  . Shoulder fracture AGE 74  . VBAC, delivered, current hospitalization 02/05/2013   Past Surgical History  Procedure Laterality Date  . Breast surgery    . Cesarean section     Family History: family history includes Asthma in her son; Cancer in her father and  paternal aunt; Early death (age of onset: 13) in her maternal uncle; Eczema in her son; Hypertension in her maternal grandmother and mother; Seizures in her maternal uncle; Tuberculosis in her maternal grandmother. Social History:  reports that she has never smoked. She does not have any smokeless tobacco history on file. She reports that she does not drink alcohol or use illicit drugs. 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 She will accept blood in an emergency.    Prenatal Transfer Tool  Maternal Diabetes: No Genetic Screening: Declined Maternal Ultrasounds/Referrals: Normal Fetal Ultrasounds or other Referrals:  None Maternal Substance Abuse:  No Significant Maternal Medications:  Meds include: Other: PNV, valACYclovir 500 mg tablet daily Significant Maternal Lab Results: Lab values include: Group B Strep positive  TDAP: 12/12/15 Flu: 07/19/15  ROS:10 Systems reviewed and are negative for acute change except as noted in the HPI.   Allergies  Allergen Reactions  . Hydrocodone-Acetaminophen Itching and Swelling    Can take Percocet  . Ciprofloxacin Itching and Swelling    Pt does not remember what happened.     Dilation: 2 Effacement (%): 40 Station: -3 Exam by:: kim Niketa Turner CNM Blood pressure 120/68, pulse 67, temperature 98.1 F (36.7 C), temperature source Oral, resp. rate 18, height  (1.651 m), weight 77.565 kg (171 lb), last menstrual period 05/21/2015, unknown if currently breastfeeding.  Chest clear Heart RRR without murmur Abd gravid, NT, FH CWD Pelvic: As above No lesions noted to external genitalia, vagina, cvx or rectum  Bishop score: 2 EFW: 7 1/4 lbs; pelvis proven to 7 lbs Cephalic by Leopolds and VE Predicted chance of vaginal birth after cesarean: 89.7%  Ext: 2+ DTRs, no clonus, no edema  FHR: BL 140 w/ moderate  variability, +accels, no decels  Toco: Irregular  Prenatal labs: ABO, Rh: O+ Antibody: Neg Rubella: Immune RPR: NR HBsAg: Neg HIV: Neg GBS: Positive (02/02/16) Sickle cell/Hgb electrophoresis: Normal study Pap: Normal 11/23/14 GC: Neg (07/12/15)  Chlamydia: Neg (07/12/15) Genetic screenings: Declined  Glucola: Normal at 84 Other: Normal TSH, neg urine culture 12/08/15 Hgb 13.2 at NOB, 12.0 at 28 weeks  Assessment: IUP at 39.6 wks IOL due to polyhydramnios Previous c-section w/ successful VBAC. Predicted chance of vaginal birth after cesarean: 89.7%. Latent phase labor FWB: Cat 1 GBS positive Allergies to Vicodin (can take Percocet) and Cipro  Plan: Admit to [facility] per consult with Dr. Charlotta Newton. Routine [facility] orders. Pain med/epidural prn. R&B of VBAC reviewed with patient and husband, including failure of trial of labor, need for intervention and/or repeat C/S, uterine rupture, or fetal compromise--patient and husband seem to understand these issues and wish to proceed with TOL. Unable to place foley bulb after several attempts, therefore will begin induction w/ low dose Pitocin.   PCN G for GBS prophylaxis per standard dosing with ROM or active labor.  Consult as indicated. Expect progress and SVD.  Sherre Scarlet, CNM 02/26/2016, 3 AM

## 2016-02-26 ENCOUNTER — Encounter (HOSPITAL_COMMUNITY): Payer: Self-pay

## 2016-02-26 ENCOUNTER — Inpatient Hospital Stay (HOSPITAL_COMMUNITY)
Admission: RE | Admit: 2016-02-26 | Discharge: 2016-03-01 | DRG: 765 | Disposition: A | Discharge status: Home / Self Care | Payer: Medicaid Other | Source: Ambulatory Visit | Attending: Obstetrics and Gynecology | Admitting: Obstetrics and Gynecology

## 2016-02-26 ENCOUNTER — Inpatient Hospital Stay (HOSPITAL_COMMUNITY): Payer: Medicaid Other | Admitting: Anesthesiology

## 2016-02-26 DIAGNOSIS — O99824 Streptococcus B carrier state complicating childbirth: Secondary | ICD-10-CM | POA: Diagnosis present

## 2016-02-26 DIAGNOSIS — O409XX0 Polyhydramnios, unspecified trimester, not applicable or unspecified: Secondary | ICD-10-CM | POA: Diagnosis present

## 2016-02-26 DIAGNOSIS — Z87410 Personal history of cervical dysplasia: Secondary | ICD-10-CM | POA: Diagnosis not present

## 2016-02-26 DIAGNOSIS — Z825 Family history of asthma and other chronic lower respiratory diseases: Secondary | ICD-10-CM

## 2016-02-26 DIAGNOSIS — O98513 Other viral diseases complicating pregnancy, third trimester: Secondary | ICD-10-CM | POA: Diagnosis present

## 2016-02-26 DIAGNOSIS — Z302 Encounter for sterilization: Secondary | ICD-10-CM | POA: Diagnosis not present

## 2016-02-26 DIAGNOSIS — O34211 Maternal care for low transverse scar from previous cesarean delivery: Secondary | ICD-10-CM | POA: Diagnosis present

## 2016-02-26 DIAGNOSIS — O403XX0 Polyhydramnios, third trimester, not applicable or unspecified: Secondary | ICD-10-CM | POA: Diagnosis present

## 2016-02-26 DIAGNOSIS — Z3A40 40 weeks gestation of pregnancy: Secondary | ICD-10-CM

## 2016-02-26 DIAGNOSIS — B009 Herpesviral infection, unspecified: Secondary | ICD-10-CM | POA: Diagnosis present

## 2016-02-26 DIAGNOSIS — O34219 Maternal care for unspecified type scar from previous cesarean delivery: Secondary | ICD-10-CM | POA: Diagnosis present

## 2016-02-26 DIAGNOSIS — Z98891 History of uterine scar from previous surgery: Secondary | ICD-10-CM

## 2016-02-26 LAB — TYPE AND SCREEN
ABO/RH(D): O POS
ANTIBODY SCREEN: NEGATIVE

## 2016-02-26 LAB — CBC
HCT: 34.7 % — ABNORMAL LOW (ref 36.0–46.0)
HEMOGLOBIN: 11.9 g/dL — AB (ref 12.0–15.0)
MCH: 27.6 pg (ref 26.0–34.0)
MCHC: 34.3 g/dL (ref 30.0–36.0)
MCV: 80.5 fL (ref 78.0–100.0)
Platelets: 181 10*3/uL (ref 150–400)
RBC: 4.31 MIL/uL (ref 3.87–5.11)
RDW: 14.1 % (ref 11.5–15.5)
WBC: 6.7 10*3/uL (ref 4.0–10.5)

## 2016-02-26 LAB — RPR: RPR: NONREACTIVE

## 2016-02-26 MED ORDER — EPHEDRINE 5 MG/ML INJ: 10.0000 mg | INTRAVENOUS | Status: DC | PRN

## 2016-02-26 MED ORDER — ONDANSETRON HCL 4 MG/2ML IJ SOLN: 4.0000 mg | Freq: Four times a day (QID) | INTRAMUSCULAR | Status: DC | PRN

## 2016-02-26 MED ORDER — LACTATED RINGERS IV SOLN
500.0000 mL | INTRAVENOUS | Status: DC | PRN
  Administered 2016-02-26: 1000 mL via INTRAVENOUS

## 2016-02-26 MED ORDER — PHENYLEPHRINE 40 MCG/ML (10ML) SYRINGE FOR IV PUSH (FOR BLOOD PRESSURE SUPPORT): 80.0000 ug | PREFILLED_SYRINGE | INTRAVENOUS | Status: DC | PRN

## 2016-02-26 MED ORDER — PHENYLEPHRINE 40 MCG/ML (10ML) SYRINGE FOR IV PUSH (FOR BLOOD PRESSURE SUPPORT)
PREFILLED_SYRINGE | INTRAVENOUS | Status: AC
  Filled 2016-02-26: qty 20

## 2016-02-26 MED ORDER — BUTORPHANOL TARTRATE 1 MG/ML IJ SOLN
INTRAMUSCULAR | Status: AC
  Filled 2016-02-26: qty 1

## 2016-02-26 MED ORDER — LACTATED RINGERS IV SOLN: 500.0000 mL | Freq: Once | INTRAVENOUS | Status: DC

## 2016-02-26 MED ORDER — TERBUTALINE SULFATE 1 MG/ML IJ SOLN: 0.2500 mg | Freq: Once | INTRAMUSCULAR | Status: DC | PRN

## 2016-02-26 MED ORDER — BUTORPHANOL TARTRATE 1 MG/ML IJ SOLN
1.0000 mg | INTRAMUSCULAR | Status: DC | PRN
  Administered 2016-02-26: 1 mg via INTRAVENOUS

## 2016-02-26 MED ORDER — LIDOCAINE HCL (PF) 1 % IJ SOLN
30.0000 mL | INTRAMUSCULAR | Status: DC | PRN
  Filled 2016-02-26: qty 30

## 2016-02-26 MED ORDER — LACTATED RINGERS IV SOLN
INTRAVENOUS | Status: DC
  Administered 2016-02-26: 16:00:00 via INTRAVENOUS
  Administered 2016-02-26: 125 mL/h via INTRAVENOUS
  Administered 2016-02-26: 22:00:00 via INTRAVENOUS

## 2016-02-26 MED ORDER — FENTANYL 2.5 MCG/ML BUPIVACAINE 1/10 % EPIDURAL INFUSION (WH - ANES)
14.0000 mL/h | INTRAMUSCULAR | Status: DC | PRN
  Administered 2016-02-26 (×2): 14 mL/h via EPIDURAL
  Filled 2016-02-26: qty 125

## 2016-02-26 MED ORDER — FLEET ENEMA 7-19 GM/118ML RE ENEM: 1.0000 | ENEMA | RECTAL | Status: DC | PRN

## 2016-02-26 MED ORDER — FENTANYL 2.5 MCG/ML BUPIVACAINE 1/10 % EPIDURAL INFUSION (WH - ANES)
INTRAMUSCULAR | Status: AC
  Filled 2016-02-26: qty 125

## 2016-02-26 MED ORDER — OXYTOCIN 40 UNITS IN LACTATED RINGERS INFUSION - SIMPLE MED: 2.5000 [IU]/h | INTRAVENOUS | Status: DC

## 2016-02-26 MED ORDER — FENTANYL CITRATE (PF) 100 MCG/2ML IJ SOLN: 50.0000 ug | INTRAMUSCULAR | Status: DC | PRN

## 2016-02-26 MED ORDER — LIDOCAINE HCL (PF) 1 % IJ SOLN
INTRAMUSCULAR | Status: DC | PRN
  Administered 2016-02-26 (×2): 4 mL via EPIDURAL

## 2016-02-26 MED ORDER — DIPHENHYDRAMINE HCL 50 MG/ML IJ SOLN: 12.5000 mg | INTRAMUSCULAR | Status: DC | PRN

## 2016-02-26 MED ORDER — OXYTOCIN BOLUS FROM INFUSION: 500.0000 mL | INTRAVENOUS | Status: DC

## 2016-02-26 MED ORDER — SOD CITRATE-CITRIC ACID 500-334 MG/5ML PO SOLN
30.0000 mL | ORAL | Status: DC | PRN
  Administered 2016-02-27: 30 mL via ORAL
  Filled 2016-02-26: qty 15

## 2016-02-26 MED ORDER — LACTATED RINGERS IV SOLN
INTRAVENOUS | Status: DC
  Administered 2016-02-26: 19:00:00 via INTRAUTERINE

## 2016-02-26 MED ORDER — SODIUM BICARBONATE 8.4 % IV SOLN
INTRAVENOUS | Status: DC | PRN
  Administered 2016-02-26 – 2016-02-27 (×3): 5 mL via EPIDURAL
  Administered 2016-02-27: 3 mL via EPIDURAL
  Administered 2016-02-27: 5 mL via EPIDURAL
  Administered 2016-02-27: 2 mL via EPIDURAL
  Administered 2016-02-27: 5 mL via EPIDURAL

## 2016-02-26 MED ORDER — OXYTOCIN 40 UNITS IN LACTATED RINGERS INFUSION - SIMPLE MED
1.0000 m[IU]/min | INTRAVENOUS | Status: DC
  Administered 2016-02-26: 1 m[IU]/min via INTRAVENOUS
  Administered 2016-02-26: 3 m[IU]/min via INTRAVENOUS
  Filled 2016-02-26: qty 1000

## 2016-02-26 MED ORDER — ACETAMINOPHEN 325 MG PO TABS: 650.0000 mg | ORAL_TABLET | ORAL | Status: DC | PRN

## 2016-02-26 MED ORDER — PENICILLIN G POTASSIUM 5000000 UNITS IJ SOLR
5.0000 10*6.[IU] | Freq: Once | INTRAVENOUS | Status: AC
  Administered 2016-02-26: 5 10*6.[IU] via INTRAVENOUS
  Filled 2016-02-26: qty 5

## 2016-02-26 MED ORDER — DEXTROSE 5 % IV SOLN
2.5000 10*6.[IU] | INTRAVENOUS | Status: DC
  Administered 2016-02-26 (×5): 2.5 10*6.[IU] via INTRAVENOUS
  Filled 2016-02-26 (×9): qty 2.5

## 2016-02-26 NOTE — Progress Notes (Signed)
Family at bedside.  Subjective: Resting between ctxs. Desires to ambulate whenever possible. Would like to hold off on pain medication as long as possible.   Objective: BP 99/75 mmHg  Pulse 69  Temp(Src) 98.2 F (36.8 C) (Oral)  Resp 18  Ht 5\' 5"  (1.651 m)  Wt 77.565 kg (171 lb)  BMI 28.46 kg/m2  LMP 05/21/2015 Today's Vitals   02/26/16 0430 02/26/16 0547 02/26/16 0600 02/26/16 0633  BP: 120/68 112/67 99/75   Pulse: 67 73 69   Temp:   98.2 F (36.8 C)   TempSrc:   Oral   Resp:   18   Height:      Weight:      PainSc:   4  2     FHT: BL 135 w/ moderate variability, +accels, no decels UC:   irregular, every 5-6 minutes SVE:   Dilation: 2 Effacement (%): 40 Station: -3 Exam by:: KimWilliams@ [id number] ---- head ballotable/not engaged, cvx posterior Pitocin at 3 mU/min  Assessment:  IOL d/t polyhydramnios Previous c-section w/ successful VBAC Positive HSV 1&2 GBS positive Cat 1 FHRT  Plan: Continue induction. Dr. Charlotta Newtonzan to assume care at 0700; pt aware.  Sherre ScarletWILLIAMS, Taijuan Serviss CNM 02/26/2016, 6:40 AM

## 2016-02-26 NOTE — Anesthesia Procedure Notes (Signed)
Epidural Patient location during procedure: OB Start time: 02/26/2016 3:30 PM End time: 02/26/2016 3:36 PM  Staffing Anesthesiologist: Shona SimpsonHOLLIS, Makaylie Dedeaux D Performed by: anesthesiologist   Preanesthetic Checklist Completed: patient identified, site marked, surgical consent, pre-op evaluation, timeout performed, IV checked, risks and benefits discussed and monitors and equipment checked  Epidural Patient position: sitting Prep: ChloraPrep Patient monitoring: heart rate, continuous pulse ox and blood pressure Approach: midline Location: L3-L4 Injection technique: LOR saline  Needle:  Needle type: Tuohy  Needle gauge: 17 G Needle length: 9 cm Catheter type: closed end flexible Catheter size: 20 Guage Test dose: negative and 1.5% lidocaine  Assessment Events: blood not aspirated, injection not painful, no injection resistance and no paresthesia  Additional Notes LOR @ 5  Patient identified. Risks/Benefits/Options discussed with patient including but not limited to bleeding, infection, nerve damage, paralysis, failed block, incomplete pain control, headache, blood pressure changes, nausea, vomiting, reactions to medications, itching and postpartum back pain. Confirmed with bedside nurse the patient's most recent platelet count. Confirmed with patient that they are not currently taking any anticoagulation, have any bleeding history or any family history of bleeding disorders. Patient expressed understanding and wished to proceed. All questions were answered. Sterile technique was used throughout the entire procedure. Please see nursing notes for vital signs. Test dose was given through epidural catheter and negative prior to continuing to dose epidural or start infusion. Warning signs of high block given to the patient including shortness of breath, tingling/numbness in hands, complete motor block, or any concerning symptoms with instructions to call for help. Patient was given instructions on  fall risk and not to get out of bed. All questions and concerns addressed with instructions to call with any issues or inadequate analgesia.    Reason for block:procedure for pain

## 2016-02-26 NOTE — Progress Notes (Signed)
OB PN:  S: Rates contraction pain 5/10.  Declined medication at this time.  O: BP 125/76 mmHg  Pulse 67  Temp(Src) 98.2 F (36.8 C) (Oral)  Resp 18  Ht 5\' 5"  (1.651 m)  Wt 77.565 kg (171 lb)  BMI 28.46 kg/m2  LMP 05/21/2015  FHT: 145bpm, moderate variablity, + accels, no decels Toco: q2-574min SVE: 4-5/60/-3, AROM performed with FSE- clear fluid  A/P: 30 y.o. [id number] @ 4757w6d for IOL for polyhydramnios 1. FWB: Cat. I 2. Labor: continue with Pit per protocol Pain: IV Stadol prn GBS: positive, continue PCN per protocol  Myna HidalgoJennifer Aislee Landgren, DO [phone] (pager) [phone] (office)

## 2016-02-26 NOTE — Progress Notes (Addendum)
Subjective: Doing well, received several epidural bolus for pain.  Reports she is starting to feel pressure again.   Objective: BP 127/79 mmHg  Pulse 73  Temp(Src) 97.8 F (36.6 C) (Oral)  Resp 18  Ht 5\' 5"  (1.651 m)  Wt 77.565 kg (171 lb)  BMI 28.46 kg/m2  LMP 05/21/2015      FHT: Category 1, 130-140 bpm, moderate variability, +accels UC:   regular, every 2-3 minutes  SVE:   Dilation: 8.5 Effacement (%): 90 Station: 0 Exam by:: Princella Jaskiewicz (Earland Reish) Membranes: Arom at 1308, clear, moderate Induction/Augmentation: Cytotec: N/A Foley Bulb: placed 1052, Expelled Pitocin: 10 mu/min, MVUs 200    Pain management: IV med: Stadol @ 1045 Epidural. placed 1537   GBS prophylaxis: S/p 6 doses PCN, last 2100  Assessment:  IUP 40.1 wks Transitional labor VBAC IOL Pitocin AROM x 9.5 hrs Hx VBAC x1  GBS positive Cat 1 FT  Plan: Continue pitocin IOL  Anticipate SVD   Alphonzo Severanceachel Kizzi Overbey CNM, [place] 02/26/2016, 10:21 PM

## 2016-02-26 NOTE — Progress Notes (Signed)
Failed attempt at foley bulb insertion by CNM r/t/ position of pt's cervix; patient refused placement.

## 2016-02-26 NOTE — Anesthesia Preprocedure Evaluation (Signed)
Anesthesia Evaluation  Patient identified by MRN, date of birth, ID band Patient awake    Reviewed: Allergy & Precautions, NPO status , Patient's Chart, lab work & pertinent test results  Airway Mallampati: II  TM Distance: >3 FB Neck ROM: Full    Dental  (+) Teeth Intact   Pulmonary neg pulmonary ROS,    breath sounds clear to auscultation       Cardiovascular hypertension,  Rhythm:Regular Rate:Normal     Neuro/Psych  Headaches, negative psych ROS   GI/Hepatic negative GI ROS, Neg liver ROS,   Endo/Other  negative endocrine ROS  Renal/GU negative Renal ROS  negative genitourinary   Musculoskeletal negative musculoskeletal ROS (+)   Abdominal   Peds negative pediatric ROS (+)  Hematology negative hematology ROS (+)   Anesthesia Other Findings   Reproductive/Obstetrics (+) Pregnancy                             Lab Results  Component Value Date   WBC 6.7 02/26/2016   HGB 11.9* 02/26/2016   HCT 34.7* 02/26/2016   MCV 80.5 02/26/2016   PLT 181 02/26/2016   No results found for: INR, PROTIME   Anesthesia Physical Anesthesia Plan  ASA: II  Anesthesia Plan: Epidural   Post-op Pain Management:    Induction:   Airway Management Planned:   Additional Equipment:   Intra-op Plan:   Post-operative Plan:   Informed Consent: I have reviewed the patients History and Physical, chart, labs and discussed the procedure including the risks, benefits and alternatives for the proposed anesthesia with the patient or authorized representative who has indicated his/her understanding and acceptance.     Plan Discussed with:   Anesthesia Plan Comments:         Anesthesia Quick Evaluation

## 2016-02-26 NOTE — Progress Notes (Signed)
Subjective: Assumed care of patient.   In to greet and meet the acquaintance of pt and family.  Pt appears uncomfortable and breathing through contractions. States she is feeling "a lot of pressure".   Objective: BP 105/49 mmHg  Pulse 71  Temp(Src) 97.9 F (36.6 C) (Oral)  Resp 16  Ht 5\' 5"  (1.651 m)  Wt 77.565 kg (171 lb)  BMI 28.46 kg/m2  LMP 05/21/2015      FHT: Category 1, 120-130 ,moderate variability, +Accels, no decels UC:   regular, every 2-3 minutes SVE:   Dilation: 8 Effacement (%): 90 Station: 0 Exam by:: Alphonzo Severanceachel Detroit Frieden, CNM  Membranes: Arom at 1308, clear, moderate Induction/Augmentation: Cytotec:  N/A Foley Bulb: placed 1052, Expelled Pitocin:  6 mu/min, MVUs 280   Pain management: IV med:   Stadol @ 1045 Epidural. placed 1537   GBS prophylaxis:   S/p 5 doses, last 1841   Assessment:  IUP 40.1 wks IOL polyhydramnios VBAC Pitocin AROM x 7hrs Hx VBAC x1  GBS positive Cat 1 FT  Plan: Continue pitocin IOL    Anticipate SVD   Alphonzo Severanceachel Calina Patrie CNM, [place] 02/26/2016, 7:29 PM

## 2016-02-26 NOTE — Progress Notes (Signed)
OB PN:  S: Pt crying with painful contractions- declined medication at this time.  O: BP 116/71 mmHg  Pulse 68  Temp(Src) 98 F (36.7 C) (Oral)  Resp 16  Ht 5\' 5"  (1.651 m)  Wt 77.565 kg (171 lb)  BMI 28.46 kg/m2  LMP 05/21/2015  FHT: 145bpm, moderate variablity, + accels, no decels Toco: q2-373min SVE: 4-5/60/-3, IUPC placed  A/P: 30 y.o. [id number] @ 4134w6d for IOL for polyhydramnios 1. FWB: Cat. I 2. Labor: continue with Pit per protocol Pain: IV Stadol prn, encouraged pt to consider pain management options GBS: positive, continue PCN per protocol  Myna HidalgoJennifer Lyndzie Zentz, DO [phone] (pager) [phone] (office)

## 2016-02-26 NOTE — Progress Notes (Signed)
OB PN:  S: Resting comfortably with epidural, at bedside to replace IUPC which has fallen out  O: BP 102/55 mmHg  Pulse 67  Temp(Src) 97.8 F (36.6 C) (Oral)  Resp 16  Ht 5\' 5"  (1.651 m)  Wt 77.565 kg (171 lb)  BMI 28.46 kg/m2  LMP 05/21/2015  FHT: 145bpm, moderate variablity, + accels, occasional early decels Toco: q2-453min SVE: 4-5/60/-2, head now well applied, IUPC replaced  A/P: 30 y.o. [id number] @ 3091w6d for IOL for polyhydramnios 1. FWB: Cat. I 2. Labor: continue with Pit per protocol Pain: continue with epidural GBS: positive, continue PCN per protocol  Myna HidalgoJennifer Roderica Cathell, DO [phone] (pager) [phone] (office)

## 2016-02-26 NOTE — Anesthesia Pain Management Evaluation Note (Signed)
  [facility] Pain Management Visit Note  Patient: Donna BlalockGereka L Shuler, 30 y.o., female  "Hello I am a member of the anesthesia team at [facility]. We have an anesthesia team available at all times to provide care throughout the hospital, including epidural management and anesthesia for C-section. I don't know your plan for the delivery whether it a natural birth, water birth, IV sedation, nitrous supplementation, doula or epidural, but we want to meet your pain goals."   1.Was your pain managed to your expectations on prior hospitalizations?   No prior hospitalizations  2.What is your expectation for pain management during this hospitalization?     Labor support without medications  3.How can we help you reach that goal? Natural childbirth  Record the patient's initial score and the patient's pain goal.   Pain: 3  Pain Goal: 8 [facility] wants you to be able to say your pain was always managed very well.  Cephus ShellingBURGER,Donna [name] 02/26/2016

## 2016-02-26 NOTE — Progress Notes (Signed)
OB PN:  S: Pt feeling increased contractions pain- IV stadol given.   O: BP 110/52 mmHg  Pulse 74  Temp(Src) 98.2 F (36.8 C) (Oral)  Resp 18  Ht 5\' 5"  (1.651 m)  Wt 77.565 kg (171 lb)  BMI 28.46 kg/m2  LMP 05/21/2015  FHT: 145bpm, moderate variablity, + accels, no decels Toco: irregular SVE: 2/40/-3, posterior, Foley balloon placed on manual exam, balloon inflated with 40 cc  A/P: 30 y.o. [id number] @ 3723w6d for IOL for polyhydramnios 1. FWB: Cat. I 2. Labor: continue with Pit per protocol- foley balloon in place to remain for 6hrs Pain: IV Stadol prn GBS: positive, continue PCN per protocol  Myna HidalgoJennifer Nolan Tuazon, DO [phone] (pager) [phone] (office)

## 2016-02-27 ENCOUNTER — Encounter (HOSPITAL_COMMUNITY): Payer: Self-pay

## 2016-02-27 ENCOUNTER — Encounter (HOSPITAL_COMMUNITY)
Admission: RE | Disposition: A | Discharge status: Home / Self Care | Payer: Self-pay | Source: Ambulatory Visit | Attending: Obstetrics and Gynecology

## 2016-02-27 DIAGNOSIS — Z98891 History of uterine scar from previous surgery: Secondary | ICD-10-CM

## 2016-02-27 LAB — CBC
HCT: 34.2 % — ABNORMAL LOW (ref 36.0–46.0)
HEMOGLOBIN: 11.3 g/dL — AB (ref 12.0–15.0)
MCH: 27.2 pg (ref 26.0–34.0)
MCHC: 33 g/dL (ref 30.0–36.0)
MCV: 82.2 fL (ref 78.0–100.0)
Platelets: 166 10*3/uL (ref 150–400)
RBC: 4.16 MIL/uL (ref 3.87–5.11)
RDW: 14.4 % (ref 11.5–15.5)
WBC: 15.1 10*3/uL — ABNORMAL HIGH (ref 4.0–10.5)

## 2016-02-27 SURGERY — Surgical Case
Anesthesia: Epidural

## 2016-02-27 MED ORDER — ONDANSETRON HCL 4 MG/2ML IJ SOLN
4.0000 mg | Freq: Three times a day (TID) | INTRAMUSCULAR | Status: DC | PRN
  Administered 2016-02-27: 4 mg via INTRAVENOUS
  Filled 2016-02-27: qty 2

## 2016-02-27 MED ORDER — KETOROLAC TROMETHAMINE 30 MG/ML IJ SOLN
30.0000 mg | Freq: Four times a day (QID) | INTRAMUSCULAR | Status: AC | PRN
  Administered 2016-02-27: 30 mg via INTRAMUSCULAR

## 2016-02-27 MED ORDER — PROMETHAZINE HCL 25 MG/ML IJ SOLN: 6.2500 mg | INTRAMUSCULAR | Status: DC | PRN

## 2016-02-27 MED ORDER — LACTATED RINGERS IV SOLN
INTRAVENOUS | Status: DC | PRN
  Administered 2016-02-27 (×2): via INTRAVENOUS

## 2016-02-27 MED ORDER — CEFAZOLIN SODIUM-DEXTROSE 2-4 GM/100ML-% IV SOLN
INTRAVENOUS | Status: AC
  Filled 2016-02-27: qty 100

## 2016-02-27 MED ORDER — PRENATAL MULTIVITAMIN CH
1.0000 | ORAL_TABLET | Freq: Every day | ORAL | Status: DC
  Administered 2016-02-28 – 2016-03-01 (×3): 1 via ORAL
  Filled 2016-02-27 (×3): qty 1

## 2016-02-27 MED ORDER — DIPHENHYDRAMINE HCL 25 MG PO CAPS: 25.0000 mg | ORAL_CAPSULE | Freq: Four times a day (QID) | ORAL | Status: DC | PRN

## 2016-02-27 MED ORDER — KETOROLAC TROMETHAMINE 30 MG/ML IJ SOLN: 30.0000 mg | Freq: Four times a day (QID) | INTRAMUSCULAR | Status: AC | PRN

## 2016-02-27 MED ORDER — ZOLPIDEM TARTRATE 5 MG PO TABS: 5.0000 mg | ORAL_TABLET | Freq: Every evening | ORAL | Status: DC | PRN

## 2016-02-27 MED ORDER — SIMETHICONE 80 MG PO CHEW
80.0000 mg | CHEWABLE_TABLET | ORAL | Status: DC
  Administered 2016-02-27 – 2016-03-01 (×3): 80 mg via ORAL
  Filled 2016-02-27 (×3): qty 1

## 2016-02-27 MED ORDER — WITCH HAZEL-GLYCERIN EX PADS: 1.0000 "application " | MEDICATED_PAD | CUTANEOUS | Status: DC | PRN

## 2016-02-27 MED ORDER — IBUPROFEN 600 MG PO TABS
600.0000 mg | ORAL_TABLET | Freq: Four times a day (QID) | ORAL | Status: DC | PRN
  Administered 2016-02-28 – 2016-03-01 (×8): 600 mg via ORAL
  Filled 2016-02-27 (×9): qty 1

## 2016-02-27 MED ORDER — DIPHENHYDRAMINE HCL 50 MG/ML IJ SOLN: 12.5000 mg | INTRAMUSCULAR | Status: DC | PRN

## 2016-02-27 MED ORDER — SIMETHICONE 80 MG PO CHEW: 80.0000 mg | CHEWABLE_TABLET | ORAL | Status: DC | PRN

## 2016-02-27 MED ORDER — DEXTROSE 5 % IV SOLN
1.0000 ug/kg/h | INTRAVENOUS | Status: DC | PRN
  Filled 2016-02-27: qty 2

## 2016-02-27 MED ORDER — TETANUS-DIPHTH-ACELL PERTUSSIS 5-2.5-18.5 LF-MCG/0.5 IM SUSP: 0.5000 mL | Freq: Once | INTRAMUSCULAR | Status: DC

## 2016-02-27 MED ORDER — OXYTOCIN 40 UNITS IN LACTATED RINGERS INFUSION - SIMPLE MED: 2.5000 [IU]/h | INTRAVENOUS | Status: AC

## 2016-02-27 MED ORDER — MORPHINE SULFATE (PF) 0.5 MG/ML IJ SOLN
INTRAMUSCULAR | Status: DC | PRN
  Administered 2016-02-27: 1 mg via EPIDURAL
  Administered 2016-02-27: 3 mg via EPIDURAL

## 2016-02-27 MED ORDER — OXYTOCIN 10 UNIT/ML IJ SOLN
INTRAMUSCULAR | Status: AC
  Filled 2016-02-27: qty 4

## 2016-02-27 MED ORDER — SIMETHICONE 80 MG PO CHEW
80.0000 mg | CHEWABLE_TABLET | Freq: Three times a day (TID) | ORAL | Status: DC
  Administered 2016-02-27 – 2016-03-01 (×8): 80 mg via ORAL
  Filled 2016-02-27 (×6): qty 1

## 2016-02-27 MED ORDER — MORPHINE SULFATE (PF) 0.5 MG/ML IJ SOLN
INTRAMUSCULAR | Status: AC
  Filled 2016-02-27: qty 10

## 2016-02-27 MED ORDER — MEPERIDINE HCL 25 MG/ML IJ SOLN: 6.2500 mg | INTRAMUSCULAR | Status: DC | PRN

## 2016-02-27 MED ORDER — SODIUM CHLORIDE 0.9% FLUSH: 3.0000 mL | INTRAVENOUS | Status: DC | PRN

## 2016-02-27 MED ORDER — SCOPOLAMINE 1 MG/3DAYS TD PT72
MEDICATED_PATCH | TRANSDERMAL | Status: AC
  Filled 2016-02-27: qty 1

## 2016-02-27 MED ORDER — ONDANSETRON HCL 4 MG/2ML IJ SOLN
INTRAMUSCULAR | Status: DC | PRN
Start: 2016-02-27 — End: 2016-02-27
  Administered 2016-02-27: 4 mg via INTRAVENOUS

## 2016-02-27 MED ORDER — DIPHENHYDRAMINE HCL 25 MG PO CAPS
25.0000 mg | ORAL_CAPSULE | ORAL | Status: DC | PRN
  Filled 2016-02-27: qty 1

## 2016-02-27 MED ORDER — ONDANSETRON HCL 4 MG/2ML IJ SOLN
INTRAMUSCULAR | Status: AC
  Filled 2016-02-27: qty 2

## 2016-02-27 MED ORDER — OXYCODONE-ACETAMINOPHEN 5-325 MG PO TABS: 1.0000 | ORAL_TABLET | ORAL | Status: DC | PRN

## 2016-02-27 MED ORDER — NALBUPHINE HCL 10 MG/ML IJ SOLN
5.0000 mg | INTRAMUSCULAR | Status: DC | PRN
  Administered 2016-02-27: 5 mg via SUBCUTANEOUS
  Filled 2016-02-27 (×2): qty 1

## 2016-02-27 MED ORDER — NALOXONE HCL 0.4 MG/ML IJ SOLN: 0.4000 mg | INTRAMUSCULAR | Status: DC | PRN

## 2016-02-27 MED ORDER — SCOPOLAMINE 1 MG/3DAYS TD PT72: 1.0000 | MEDICATED_PATCH | Freq: Once | TRANSDERMAL | Status: DC

## 2016-02-27 MED ORDER — SENNOSIDES-DOCUSATE SODIUM 8.6-50 MG PO TABS
2.0000 | ORAL_TABLET | ORAL | Status: DC
  Administered 2016-02-27 – 2016-03-01 (×3): 2 via ORAL
  Filled 2016-02-27 (×3): qty 2

## 2016-02-27 MED ORDER — NALBUPHINE HCL 10 MG/ML IJ SOLN
5.0000 mg | INTRAMUSCULAR | Status: DC | PRN
  Administered 2016-02-27 – 2016-02-28 (×3): 5 mg via INTRAVENOUS
  Filled 2016-02-27 (×3): qty 1

## 2016-02-27 MED ORDER — MENTHOL 3 MG MT LOZG: 1.0000 | LOZENGE | OROMUCOSAL | Status: DC | PRN

## 2016-02-27 MED ORDER — LACTATED RINGERS IV SOLN: INTRAVENOUS | Status: DC

## 2016-02-27 MED ORDER — IBUPROFEN 800 MG PO TABS: 800.0000 mg | ORAL_TABLET | ORAL | Status: DC | PRN

## 2016-02-27 MED ORDER — NALBUPHINE HCL 10 MG/ML IJ SOLN: 5.0000 mg | Freq: Once | INTRAMUSCULAR | Status: AC | PRN

## 2016-02-27 MED ORDER — LACTATED RINGERS IV SOLN
INTRAVENOUS | Status: DC
  Administered 2016-02-27: 11:00:00 via INTRAVENOUS

## 2016-02-27 MED ORDER — OXYTOCIN 10 UNIT/ML IJ SOLN
40.0000 [IU] | INTRAMUSCULAR | Status: DC | PRN
  Administered 2016-02-27: 40 [IU] via INTRAVENOUS

## 2016-02-27 MED ORDER — FENTANYL CITRATE (PF) 100 MCG/2ML IJ SOLN: 25.0000 ug | INTRAMUSCULAR | Status: DC | PRN

## 2016-02-27 MED ORDER — COCONUT OIL OIL
1.0000 | TOPICAL_OIL | Status: DC | PRN
Start: 2016-02-27 — End: 2016-03-01

## 2016-02-27 MED ORDER — DIBUCAINE 1 % RE OINT: 1.0000 "application " | TOPICAL_OINTMENT | RECTAL | Status: DC | PRN

## 2016-02-27 MED ORDER — LIDOCAINE-EPINEPHRINE (PF) 2 %-1:200000 IJ SOLN
INTRAMUSCULAR | Status: AC
  Filled 2016-02-27: qty 20

## 2016-02-27 MED ORDER — KETOROLAC TROMETHAMINE 30 MG/ML IJ SOLN
INTRAMUSCULAR | Status: AC
  Filled 2016-02-27: qty 1

## 2016-02-27 MED ORDER — NALBUPHINE HCL 10 MG/ML IJ SOLN
5.0000 mg | Freq: Once | INTRAMUSCULAR | Status: AC | PRN
  Administered 2016-02-27: 5 mg via SUBCUTANEOUS
  Filled 2016-02-27: qty 1

## 2016-02-27 MED ORDER — SODIUM CHLORIDE 0.9 % IR SOLN
Status: DC | PRN
Start: 2016-02-27 — End: 2016-02-27
  Administered 2016-02-27: 1000 mL

## 2016-02-27 MED ORDER — SCOPOLAMINE 1 MG/3DAYS TD PT72
MEDICATED_PATCH | TRANSDERMAL | Status: DC | PRN
  Administered 2016-02-27: 1 via TRANSDERMAL

## 2016-02-27 MED ORDER — OXYCODONE-ACETAMINOPHEN 5-325 MG PO TABS: 2.0000 | ORAL_TABLET | ORAL | Status: DC | PRN

## 2016-02-27 MED ORDER — CEFAZOLIN SODIUM-DEXTROSE 2-3 GM-% IV SOLR
INTRAVENOUS | Status: DC | PRN
  Administered 2016-02-27: 2 g via INTRAVENOUS

## 2016-02-27 MED ORDER — LACTATED RINGERS IV SOLN
INTRAVENOUS | Status: DC | PRN
  Administered 2016-02-27: 03:00:00 via INTRAVENOUS

## 2016-02-27 SURGICAL SUPPLY — 39 items
BENZOIN TINCTURE PRP APPL 2/3 (GAUZE/BANDAGES/DRESSINGS) ×3 IMPLANT
CHLORAPREP W/TINT 26ML (MISCELLANEOUS) ×3 IMPLANT
CLAMP CORD UMBIL (MISCELLANEOUS) ×3 IMPLANT
CLOSURE WOUND 1/2 X4 (GAUZE/BANDAGES/DRESSINGS) ×1
CLOTH BEACON ORANGE TIMEOUT ST (SAFETY) ×3 IMPLANT
DRSG OPSITE POSTOP 4X10 (GAUZE/BANDAGES/DRESSINGS) ×3 IMPLANT
ELECT REM PT RETURN 9FT ADLT (ELECTROSURGICAL) ×3
ELECTRODE REM PT RTRN 9FT ADLT (ELECTROSURGICAL) ×1 IMPLANT
EXTRACTOR VACUUM KIWI (MISCELLANEOUS) IMPLANT
GLOVE BIOGEL PI IND STRL 6.5 (GLOVE) ×1 IMPLANT
GLOVE BIOGEL PI IND STRL 7.0 (GLOVE) ×3 IMPLANT
GLOVE BIOGEL PI INDICATOR 6.5 (GLOVE) ×2
GLOVE BIOGEL PI INDICATOR 7.0 (GLOVE) ×6
GLOVE ECLIPSE 6.5 STRL STRAW (GLOVE) ×6 IMPLANT
GOWN STRL REUS W/TWL LRG LVL3 (GOWN DISPOSABLE) ×12 IMPLANT
HEMOSTAT SURGICEL 4X8 (HEMOSTASIS) ×3 IMPLANT
KIT ABG SYR 3ML LUER SLIP (SYRINGE) IMPLANT
LIQUID BAND (GAUZE/BANDAGES/DRESSINGS) IMPLANT
NEEDLE HYPO 25X5/8 SAFETYGLIDE (NEEDLE) IMPLANT
NS IRRIG 1000ML POUR BTL (IV SOLUTION) ×3 IMPLANT
PACK C SECTION WH (CUSTOM PROCEDURE TRAY) ×3 IMPLANT
PAD ABD 7.5X8 STRL (GAUZE/BANDAGES/DRESSINGS) ×3 IMPLANT
PAD OB MATERNITY 4.3X12.25 (PERSONAL CARE ITEMS) ×3 IMPLANT
PENCIL SMOKE EVAC W/HOLSTER (ELECTROSURGICAL) ×3 IMPLANT
RTRCTR C-SECT PINK 25CM LRG (MISCELLANEOUS) ×3 IMPLANT
STRIP CLOSURE SKIN 1/2X4 (GAUZE/BANDAGES/DRESSINGS) ×2 IMPLANT
SUT PLAIN 0 NONE (SUTURE) ×3 IMPLANT
SUT PLAIN 2 0 XLH (SUTURE) ×3 IMPLANT
SUT VIC AB 0 CT1 27 (SUTURE) ×4
SUT VIC AB 0 CT1 27XBRD ANBCTR (SUTURE) ×2 IMPLANT
SUT VIC AB 0 CTX 36 (SUTURE) ×6
SUT VIC AB 0 CTX36XBRD ANBCTRL (SUTURE) ×3 IMPLANT
SUT VIC AB 2-0 CT1 27 (SUTURE) ×2
SUT VIC AB 2-0 CT1 TAPERPNT 27 (SUTURE) ×1 IMPLANT
SUT VIC AB 3-0 SH 27 (SUTURE) ×2
SUT VIC AB 3-0 SH 27X BRD (SUTURE) ×1 IMPLANT
SUT VIC AB 4-0 KS 27 (SUTURE) ×3 IMPLANT
TOWEL OR 17X24 6PK STRL BLUE (TOWEL DISPOSABLE) ×3 IMPLANT
TRAY FOLEY CATH SILVER 14FR (SET/KITS/TRAYS/PACK) IMPLANT

## 2016-02-27 NOTE — Lactation Note (Addendum)
This note was copied from a baby's chart. Lactation Consultation Note initial visit at 13 hours of age.  Mom reports baby has been latched for about 15 minutes and mom denies pain.  Mom reports difficulty with 2 older children and sore nipples, but really wants to breast feed this baby. Baby was already latched in football hold on left breast.  Baby has wide gape and strong rhythmic sucking and then some stimulation needed to maintain whole feeding.  [facility] given and discussed.  Encouraged to feed with early cues on demand.  Early newborn behavior discussed.  Hand expression demonstrated with colostrum visible.  Mom to call for assist as needed.     Patient Name: Donna [name] ZOXWR'UToday's Date: 02/27/2016 Reason for consult: Initial assessment   Maternal Data Has patient been taught Hand Expression?: Yes Does the patient have breastfeeding experience prior to this delivery?: Yes  Feeding Feeding Type: Breast Fed Length of feed:  (breastfeeding in progress observed additional 15 minutes)  LATCH Score/Interventions Latch: Grasps breast easily, tongue down, lips flanged, rhythmical sucking. Intervention(s): Adjust position;Assist with latch;Breast massage;Breast compression  Audible Swallowing: A few with stimulation Intervention(s): Skin to skin;Hand expression;Alternate breast massage  Type of Nipple: Flat (right nipple flat, left already latched)  Comfort (Breast/Nipple): Soft / non-tender     Hold (Positioning): No assistance needed to correctly position infant at breast. (already latched)  LATCH Score: 8  Lactation Tools Discussed/Used     Consult Status Consult Status: Follow-up Date: 02/28/16 Follow-up type: In-patient    Jannifer RodneyShoptaw, Jana Lynn 02/27/2016, 4:40 PM

## 2016-02-27 NOTE — Progress Notes (Signed)
Subjective: Pt expresses frustration, fatigue, and distress regarding lack of continued cervical dilation.  States she does not want to labor anymore.   Objective: BP 134/86 mmHg  Pulse 95  Temp(Src) 99.5 F (37.5 C) (Oral)  Resp 18  Ht 5\' 5"  (1.651 m)  Wt 77.565 kg (171 lb)  BMI 28.46 kg/m2  LMP 05/21/2015      FHT: Category 2, 155 bpm, moderate variability, +accels , occasional late decelerations  UC:   regular, every 3 minutes SVE:   Dilation: 8.5 Effacement (%): 90 Station: 0 Exam by:: Tajia Szeliga (Kaylynn Chamblin) Membranes: Arom at 1308, clear, moderate Induction/Augmentation: Cytotec: N/A Foley Bulb: placed 1052, Expelled Pitocin: 15 mu/min, MVUs 140  Pain management: IV med: Stadol @ 1045 Epidural. placed 1537   GBS prophylaxis: S/p 6 doses PCN, last 2100   Assessment:  Plan: Consulted Dr. Charlotta Newtonzan regarding failure of cervical dilation/progression and Pt desire for a repeat cesarean section Pt also stated she desires Bilateral tubal ligation  Consents signed Preparing patient for cesarean delivery   Alphonzo SeveranceRachel Nashley [place] CNM, [place] 02/27/2016, 2:07 AM

## 2016-02-27 NOTE — Anesthesia Postprocedure Evaluation (Signed)
Anesthesia Post Note  Patient: Donna [name]  Procedure(s) Performed: Procedure(s) (LRB): CESAREAN SECTION (N/A)  Patient location during evaluation: Mother Baby Anesthesia Type: Epidural Level of consciousness: awake, awake and alert, oriented and patient cooperative Pain management: pain level controlled Vital Signs Assessment: post-procedure vital signs reviewed and stable Respiratory status: spontaneous breathing, nonlabored ventilation and respiratory function stable Cardiovascular status: stable Postop Assessment: no headache, no backache, patient able to bend at knees and no signs of nausea or vomiting Anesthetic complications: no     Last Vitals:  Filed Vitals:   02/27/16 0730 02/27/16 0915  BP: 104/53   Pulse: 80   Temp: 36.9 C 36.8 C  Resp: 16 18    Last Pain:  Filed Vitals:   02/27/16 1058  PainSc: 0-No pain   Pain Goal:                 Angalina Ante L

## 2016-02-27 NOTE — Progress Notes (Signed)
Len BlalockGereka L Holton [id number]  Subjective: Postpartum Day 0: Repeat C/S due to failed TOC, FTP--with BTL, lysis of adhesions, scar revision. Patient up in chair, denies syncope or dizziness. Feeding:  Breast Contraceptive plan:  Undecided  Objective: Temp:  [97.8 F (36.6 C)-99.5 F (37.5 C)] 99.4 F (37.4 C) (06/19 [id number]) Pulse Rate:  [63-100] 71 (06/19 0632) Resp:  [13-28] 18 (06/19 11910632) BP: (98-135)/(49-88) 118/55 mmHg (06/19 0632) SpO2:  [96 %-100 %] 96 % (06/19 0632)  CBC Latest Ref Rng 02/27/2016 02/26/2016 02/06/2013  WBC 4.0 - 10.5 K/uL 15.1(H) 6.7 15.9(H)  Hemoglobin 12.0 - 15.0 g/dL 11.3(L) 11.9(L) 11.5(L)  Hematocrit 36.0 - 46.0 % 34.2(L) 34.7(L) 34.7(L)  Platelets 150 - 400 K/uL 166 181 186     Physical Exam:  General: alert Lochia: appropriate Uterine Fundus: firm Abdomen:  Faint bowel sounds Incision: Pressure dressing CDI DVT Evaluation: No evidence of DVT seen on physical exam. Negative Homan's sign.   Assessment/Plan: Status post repeat cesarean delivery due to failed TOL, BTL, lysis of adhesions, scar revision, day 0 Stable Continue current care.    Nigel BridgemanLATHAM, Danija Gosa MSN, CNM 02/27/2016, 10:43 AM

## 2016-02-27 NOTE — Transfer of Care (Signed)
Immediate Anesthesia Transfer of Care Note  Patient: Donna [name]  Procedure(s) Performed: Procedure(s): CESAREAN SECTION (N/A)  Patient Location: PACU  Anesthesia Type:Epidural  Level of Consciousness: awake  Airway & Oxygen Therapy: Patient Spontanous Breathing  Post-op Assessment: Report given to RN and Post -op Vital signs reviewed and stable  Post vital signs: stable  Last Vitals:  Filed Vitals:   02/27/16 0130 02/27/16 0155  BP: 134/86   Pulse: 95   Temp:  37.5 C  Resp:      Last Pain:  Filed Vitals:   02/27/16 0303  PainSc: 6          Complications: No apparent anesthesia complications

## 2016-02-27 NOTE — Transfer of Care (Signed)
Immediate Anesthesia Transfer of Care Note  Patient: Donna [name]  Procedure(s) Performed: Procedure(s): CESAREAN SECTION (N/A)  Patient Location: PACU  Anesthesia Type:Epidural  Level of Consciousness: awake, alert  and oriented  Airway & Oxygen Therapy: Patient Spontanous Breathing  Post-op Assessment: Report given to RN and Post -op Vital signs reviewed and stable  Post vital signs: Reviewed and stable  Last Vitals:  Filed Vitals:   02/27/16 1330 02/27/16 1800  BP: 113/65 110/61  Pulse: 73 82  Temp: 37 C 36.8 C  Resp: 16 18    Last Pain:  Filed Vitals:   02/27/16 1822  PainSc: 0-No pain         Complications: No apparent anesthesia complications

## 2016-02-27 NOTE — Op Note (Signed)
PreOp Diagnosis:  Intrauterine pregnancy @ 7135w0d Failure to progress Prior C-section, failed TOLAC Polyhydramnios  PostOp Diagnosis: same  Procedure: Repeat C-section, Bilateral tubal ligation, lysis of adhesions, scar revision Surgeon: Dr. Myna HidalgoJennifer Mykell Rawl Assistant: Alphonzo Severanceachel Stall, CNM Anesthesia: epidural Complications: none EBL: 800cc UOP: 300cc Fluids: 2500cc  Specimen: 2 segments of tube sent to pathology.  Findings: Female infant from OP presentation, normal right fallopian tube and ovary, denses adhesions between left ovary and fallopian tube.  PROCEDURE:  Informed consent was obtained from the patient with risks, benefits, complications, treatment options, and expected outcomes discussed with the patient.  The patient concurred with the proposed plan, giving informed consent with form signed.   The patient was taken to Operating Room, and identified with the procedure verified as C-Section Delivery with Time Out. With induction of anesthesia, the patient was prepped and draped in the usual sterile fashion.  An incision was made around the incision and the old scar was removed.  The incision was then carried down to the subcutaneous tissue to the fascia. The fascia was incised in the midline and extended transversely. The superior aspect of the fascial incision was grasped with Kochers elevated and the underlying muscle dissected off. The inferior aspect of the facial incision was in similar fashion, grasped elevated and rectus muscles dissected off. The peritoneum was identified and entered. Peritoneal incision was extended longitudinally. The Alexis retractor was placed.  The utero-vesical peritoneal reflection was identified and incised transversely with the [facility] scissors, the incision extended laterally, the bladder flap created digitally. A low transverse uterine incision was made and the infants head delivered atraumatically. After the umbilical cord was clamped and cut cord blood was  obtained for evaluation.   The placenta was removed intact and appeared normal. The uterine outline, tubes and ovaries as described above. The uterine incision was closed with running locked sutures of 0 Vicryl and a second layer of the same stitch was used in an imbricating fashion.  Excellent hemostasis was obtained.   Attention was turned to the left side where dense adhesions as described above were noted.  Using sharp and blunt dissection the tube was dissected off the ovary.  Bleeding was noted and hemostasis was obtained using interrupted 4-0 vicryl.  Once the anatomy had been normalized, a 3cm section of tube was isolated.  Each side was tied using a free tie and individual ties of 0 plain catgut suture were used.  The tube was then excised. Hemostasis was achieved with the bovie. An identical procedure was carried out on the opposite side with removal of a 3cm section of fallopian tube.  Again hemostasis was adequate.  The left side was re-examined and hemostasis was noted.  The pericolic gutters were then cleared of all clots and debris. Surgicel was placed over the left ovary and tube as well as the hysterotomy.  The Alexis retractor was removed.  The peritoneum was closed in a running fashion. The fascia was then reapproximated with running sutures of 0 Vicryl. The skin was closed with 4-0 vicryl in a subcuticular fashion.  Instrument, sponge, and needle counts were correct prior the abdominal closure and at the conclusion of the case. The patient was taken to recovery in stable condition.  Myna HidalgoJennifer Dajour [place], DO [phone] (pager) [phone] (office)

## 2016-02-27 NOTE — Progress Notes (Signed)
At bedside to discuss repeat C-section due to failure to progress.  Also, pt desires permanent sterilization and desires tubal ligation.  Risk benefits and alternatives of cesarean section were discussed with the patient including but not limited to infection, bleeding, damage to bowel , bladder and baby with the need for further surgery. Pt voiced understanding and desires to proceed.  Bilateral tubal ligation reviewed with R&B including but not limited to bleeding, infection, injury to other organs, irreversibility and failure rate of 09/998. Questions answered and pt wishes to proceed.   Myna HidalgoJennifer Cejay Cambre, DO [phone] (pager) [phone] (office)

## 2016-02-28 NOTE — Lactation Note (Signed)
This note was copied from a baby's chart. Lactation Consultation Note  Patient Name: Donna Berdie OgrenGereka [name] ZOXWR'UToday's Date: 02/28/2016 Reason for consult: Follow-up assessment Baby at 37 hr of life. Mom reports baby has picked up on feeding frequency and improved latch. She denies breast or nipple pain, voiced no concerns. Baby has had 10 bf, 3 stools, and 6 wets in 24hr. Baby will be wt again around 2300 tonight. Mom has used the DEBP x1 today. She will try to use it more often if baby starts to space out bf. She is manually expressing to get baby latched but is not offering her expressed milk after feedings. Discussed baby behavior, feeding frequency, baby belly size, voids, wt loss, breast changes, and nipple care. She is aware of lactation services and support group.    Maternal Data    Feeding Feeding Type: Breast Fed Length of feed: 18 min  LATCH Score/Interventions Latch: Grasps breast easily, tongue down, lips flanged, rhythmical sucking.  Audible Swallowing: A few with stimulation  Type of Nipple: Everted at rest and after stimulation  Comfort (Breast/Nipple): Soft / non-tender     Hold (Positioning): No assistance needed to correctly position infant at breast.  LATCH Score: 9  Lactation Tools Discussed/Used     Consult Status Consult Status: Follow-up Date: 02/29/16 Follow-up type: In-patient    Donna [name] 02/28/2016, 4:07 PM

## 2016-02-28 NOTE — Progress Notes (Signed)
Subjective:  Postpartum Day 1: Cesarean Delivery Patient reports incisional pain and tolerating PO.    Objective: Vital signs in last 24 hours: Temp:  [98.1 F (36.7 C)-98.9 F (37.2 C)] 98.1 F (36.7 C) (06/20 0607) Pulse Rate:  [71-87] 71 (06/20 0607) Resp:  [16-20] 18 (06/20 0607) BP: (95-113)/(51-65) 101/61 mmHg (06/20 0635) SpO2:  [93 %-100 %] 93 % (06/20 0310)  Physical Exam:  General: alert and no distress Lochia: appropriate Uterine Fundus: firm Incision: Dressing is dry. DVT Evaluation: No evidence of DVT seen on physical exam.   Recent Labs  02/26/16 0125 02/27/16 0702  HGB 11.9* 11.3*  HCT 34.7* 34.2*    Assessment/Plan: Status post Cesarean section. Doing well postoperatively.  Breast feeding. Offered DC tomorrow.  Isidro Monks V 02/28/2016, 9:02 AM

## 2016-02-28 NOTE — Lactation Note (Addendum)
This note was copied from a baby's chart. Lactation Consultation Note Baby had 7% weight loss. Output 4 stools, 2 voids, 7 BF in 27 hrs. May contribute to some of weight loss. Mom currently BF in recliner in football position. Mom asked to check latch. Repositioned baby cheeks closer to mom, head pulling down from breast. Mom had prop under head, wasn't close enough. Shoulders twisted slightly inwards and LE not close to mom. Discussed positioning for obtaining deep latch and comfort for mom and baby. Discussed weight loss. Hand expressed easy colostrum noted. RN had set up DEBP for stimulation q3hrs. Mom encouraged to feed baby 8-12 times/24 hours and with feeding cues. Mom encouraged to waken baby for feeds. Reviewed supply and demand, I&O, cluster feeding.  Patient Name: Donna [name] YQMVH'QToday's Date: 02/28/2016 Reason for consult: Follow-up assessment;Infant weight loss   Maternal Data    Feeding Feeding Type: Breast Fed Length of feed: 15 min  LATCH Score/Interventions Latch: Grasps breast easily, tongue down, lips flanged, rhythmical sucking. Intervention(s): Assist with latch;Adjust position;Breast massage;Breast compression  Audible Swallowing: A few with stimulation Intervention(s): Hand expression;Alternate breast massage  Type of Nipple: Everted at rest and after stimulation Intervention(s): Double electric pump  Comfort (Breast/Nipple): Soft / non-tender     Hold (Positioning): Assistance needed to correctly position infant at breast and maintain latch. Intervention(s): Position options  LATCH Score: 8  Lactation Tools Discussed/Used Tools: Pump Breast pump type: Double-Electric Breast Pump Pump Review: Setup, frequency, and cleaning Initiated by:: Donna Rainbowonisha White RN  Date initiated:: 02/28/16   Consult Status Consult Status: Follow-up Date: 02/28/16 (in pm) Follow-up type: In-patient    Charyl DancerCARVER, Sheilia Reznick G 02/28/2016, 6:32 AM

## 2016-02-29 NOTE — Progress Notes (Signed)
Subjective: Postpartum Day 2: Cesarean Delivery Patient reports tolerating PO and no problems voiding.    Objective: Vital signs in last 24 hours: Temp:  [98 F (36.7 C)-98.2 F (36.8 C)] 98 F (36.7 C) (06/21 [id number]) Pulse Rate:  [66-75] 66 (06/21 0608) Resp:  [18] 18 (06/21 0608) BP: (106-107)/(59-64) 106/64 mmHg (06/21 [id number])  Physical Exam:  General: alert and cooperative Lochia: appropriate Uterine Fundus: firm Incision: healing well, no significant drainage, no dehiscence DVT Evaluation: No evidence of DVT seen on physical exam.   Recent Labs  02/27/16 0702  HGB 11.3*  HCT 34.2*    Assessment/Plan: Status post Cesarean section. Doing well postoperatively.  Continue current care.  Elbie Statzer A 02/29/2016, 2:19 PM

## 2016-02-29 NOTE — Lactation Note (Signed)
This note was copied from a baby's chart. Lactation Consultation Note; Mom reports that baby has been cluster feeding-15 feedings in last 24 hours. 3 voids 3 stools. Baby last fed 1 hour ago and is asleep in dad's arms. Mom reports breasts are feeling a little fuller this morning. Reports baby is latching well- has a little pain with initial latch that eases off after 30 secs. Encouraged massage and hand expression before latching baby. DEBP setup in room due to 10% weight loss.Mom states she has not had time to pump. Encouraged to pump this afternoon and feed all EBM to baby with spoon or syringe. To call for assist with supplementing as needed. No questions at present. To call for assist prn  Patient Name: Donna [name]: 02/29/2016 Reason for consult: Follow-up assessment   Maternal Data Formula Feeding for Exclusion: No Has patient been taught Hand Expression?: Yes Does the patient have breastfeeding experience prior to this delivery?: Yes  Feeding Feeding Type: Breast Fed  LATCH Score/Interventions                      Lactation Tools Discussed/Used     Consult Status Consult Status: Follow-up [name]: 03/01/16 Follow-up type: In-patient    Pamelia HoitWeeks, Loden Laurent D 02/29/2016, 1:02 PM

## 2016-03-01 MED ORDER — OXYCODONE-ACETAMINOPHEN 5-325 MG PO TABS: 2.0000 | ORAL_TABLET | ORAL | Status: DC | PRN

## 2016-03-01 MED ORDER — IBUPROFEN 600 MG PO TABS: 600.0000 mg | ORAL_TABLET | Freq: Four times a day (QID) | ORAL | Status: DC | PRN

## 2016-03-01 NOTE — Discharge Instructions (Signed)
Cesarean Delivery Cesarean delivery is the birth of a baby through a cut (incision) in the abdomen and womb (uterus).  LET YOUR HEALTH CARE PROVIDER KNOW ABOUT:  All medicines you are taking, including vitamins, herbs, eye drops, creams, and over-the-counter medicines.  Previous problems you or members of your family have had with the use of anesthetics.  Any bleeding or blood clotting disorders you have.  Family history of blood clots or bleeding disorders.  Any history of deep vein thrombosis (DVT) or pulmonary embolism (PE).  Previous surgeries you have had.  Medical conditions you have.  Any allergies you have.  Complicationsinvolving the pregnancy. RISKS AND COMPLICATIONS  Generally, this is a safe procedure. However, as with any procedure, complications can occur. Possible complications include:  Bleeding.  Infection.  Blood clots.  Injury to surrounding organs.  Problems with anesthesia.  Injury to the baby. BEFORE THE PROCEDURE   You may be given an antacid medicine to drink. This will prevent acid contents in your stomach from going into your lungs if you vomit during the surgery.  You may be given an antibiotic medicine to prevent infection. PROCEDURE   To prevent infection of your incision:  Hair may be removed from your pubic area if it is near your incision.  The skin of your pubic area and lower abdomen will be cleaned with a germ-killing solution (antiseptic).  A tube (Foley catheter) will be placed in your bladder to drain your urine from your bladder into a bag. This keeps your bladder empty during surgery.  An IV tube will be placed in your vein.  You may be given medicine to numb the lower half of your body (regional anesthetic). If you were in labor, you may have already had an epidural in place which can be used in both labor and cesarean delivery. You may possibly be given medicine to make you sleep (general anesthetic) though this is not as  common.  Your heart rate and your baby's heart rate will be monitored.  An incision will be made in your abdomen that extends to your uterus. There are 2 basic kinds of incisions:  The horizontal (transverse) incision. Horizontal incisions are from side to side and are used for most routine cesarean deliveries.  The vertical incision. The vertical incision is from the top of the abdomen to the bottom and is less commonly used. It is often done for women who have a serious complication (extreme prematurity) or under emergency situations.  The horizontal and vertical incisions may both be used at the same time. However, this is very uncommon.  An incision is then made in your uterus to deliver the baby.  Your baby will be delivered.  Your health care provider may place the baby on your chest. It is important to keep the baby warm. Your health care provider will dry off the baby, place the baby directly on your bare skin, and cover the baby with warm, dry blankets.  Both incisions will be closed with absorbable stitches. AFTER THE PROCEDURE   If you were awake during the surgery, you will see your baby right away. If you were asleep, you will see your baby as soon as you are awake.  You may breastfeed your baby after surgery.  You may be able to get up and walk the same day as the surgery. If you need to stay in bed for a period of time, you will receive help to turn, cough, and take deep breaths after   surgery. This helps prevent lung problems such as pneumonia.  Do not get out of bed alone the first time after surgery. You will need help getting out of bed until you are able to do this by yourself.  You may be able to shower the day after your cesarean delivery. After the bandage (dressing) is taken off the incision site, a nurse will assist you to shower if you would like help.  You may be directed to take actions to help prevent blood clots in your legs. These may  include:  Walking shortly after surgery, with someone assisting you. Moving around after surgery helps to improve blood flow.  Wearing compression stockings or using different types of devices.  Taking medicines to thin your blood (anticoagulants) if you are at high risk for DVT or PE.  Save any blood clots that you pass from your vagina. If you pass a clot while on the toilet, do not flush it. Call for the nurse. Tell the nurse if you think you are bleeding too much or passing too many clots.  You will be given medicine for pain and nausea as needed. Let your health care providers know if you are hurting. You may also be given an antibiotic to prevent an infection.  Your IV tube will be taken out when you are drinking a reasonable amount of fluids. The Foley catheter is taken out when you are up and walking.  If your blood type is Rh negative and your baby's blood type is Rh positive, you will be given a shot of anti-D immune globulin. This shot prevents you from having Rh problems with a future pregnancy. You should get the shot even if you had your tubes tied (tubal ligation).  If you are allowed to take the baby for a walk, place the baby in the bassinet and push it.   This information is not intended to replace advice given to you by your health care provider. Make sure you discuss any questions you have with your health care provider.   Document Released: 08/27/2005 Document Revised: 05/18/2015 Document Reviewed: 04/23/2012 [employer] Interactive Patient Education 2016 [employer]  

## 2016-03-01 NOTE — Lactation Note (Signed)
This note was copied from a baby's chart. Lactation Consultation Note  Patient Name: Donna [name]'UToday's Date: 03/01/2016 Reason for consult: Follow-up assessment;Infant weight loss Visited with Mom and FOB this morning, baby 5678 hrs old.  Baby is at 10.6% WL this am (increase from 9.9% yesterday)  Baby has been cluster feeding during the night every 2 hrs per Mom.  Mom states she gave baby one supplement of 10 ml EBM after breastfeeding by curved tip syringe.  She doesn't feel comfortable doing this, so will demonstrate cup feeding or slow flow bottle to give her another option.  Baby fed for 12 minutes on right breast, and would not awaken for 2nd breast.  Latch score of 9 given.  Instructed Mom to double pump for 15-20 minutes, and we will assist her to supplement baby her EBM.  Mom had not been placing baby skin to skin during feedings, explained the importance of this.  She had not been using pillow support for positioning baby in cross cradle hold.  Discussed the importance of holding baby in close to breast.  Demonstrated how to gently tug on baby's chin after latch to uncurl her lip for a better seal.  Mom described a more comfortable latch this morning, and she noted more swallowing at this feeding.  When manually expressed, milk appears to be transitional (white).  Mom has a DEBP at home ([employer]).  Recommended pumping 15-20 mins post feedings, and offering supplement 10-20 ml EBM after breast feeding until baby is consisently gaining weight.    Consult Status Consult Status: Follow-up Date: 03/01/16 Follow-up type: In-patient    [name], Loran Fleet E 03/01/2016, 9:16 AM

## 2016-03-01 NOTE — Discharge Summary (Signed)
  Obstetric Discharge Summary  Reason for Admission: onset of labor on 02/25/16 Prenatal Procedures: none Intrapartum Procedures: cesarean: low cervical, transverse by Dr Charlotta Newtonzan on 02/26/18 due to failed TOLAC and polyhydramnios Postpartum Procedures: none Complications-Operative and Postpartum: none  HEMOGLOBIN  Date Value Ref Range Status  02/27/2016 11.3* 12.0 - 15.0 g/dL Final   HCT  Date Value Ref Range Status  02/27/2016 34.2* 36.0 - 46.0 % Final    Discharge Diagnoses: Term Pregnancy-delivered  Physical exam:   General: normal Lochia: appropriate Uterine Fundus: o/2 firm non-tender Incision: dressing dry and clean Extremities: No evidence of DVT seen on physical exam. Edema 2+   Hospital course: uncomplicated  Date: 03/01/2016 Activity: unrestricted Diet: routine Medications: Ibuprofen and Percocet Condition: stable  Breastfeeding:   Yes.   Contraception:  Postpartum Sterilization bilateral tubal ligation  Instructions: refer to practice specific booklet Discharge to: home   Newborn Data:   Baby female Name: Donna [name]  Donna [name] A MD 03/01/2016, 10:35 AM

## 2022-12-06 ENCOUNTER — Encounter (HOSPITAL_BASED_OUTPATIENT_CLINIC_OR_DEPARTMENT_OTHER): Payer: Self-pay | Admitting: Obstetrics and Gynecology

## 2022-12-06 NOTE — Progress Notes (Signed)
12/06/2022 2:50 PM Spoke w/ via phone for pre-op interview---patient Lab needs dos---- urine pregnancy Lab results------in EPIC COVID test -----patient states asymptomatic no test needed Arrive at -------0930 NPO after MN NO Solid Food.  Clear liquids from MN until---0730 Med rec completed Medications to take morning of surgery ----none Diabetic medication -----na Patient instructed no nail polish to be worn day of surgery Patient instructed to bring photo id and insurance card day of surgery Patient aware to have Driver (ride ) / caregiver    for 24 hours after surgery -Mother Nunzio Cory Patient Special Instructions ----na, no smoking 24 hrs prior Pre-Op special Istructions -----na Patient verbalized understanding of instructions that were given at this phone interview. Patient denies shortness of breath, chest pain, fever, cough at this phone interview.  Anye Brose, Arville Lime

## 2022-12-10 NOTE — H&P (Signed)
Donna [name] is a 37 y.o. female,  P: 3-0-0-3 who presents for an endometrial ablation because of heavy menstrual flow. Patient's menstrual periods lasts 5 days with the need to change a pad/tampon every hour. Admits to dime sized clots and occasionally soiling of clothes.  Also reports lower back and pelvic cramping. A pelvic ultrasound done 11/27/2022 revealed a uterine volume of 147 cc, 9.18 x 6.22 x 4.92 cm, endometrium: 12.034 mm with increased vascularity; right ovary-3.45 cm and left ovary-3.23 cm.  In February of this year the patient reports having 2 episodes of vaginal bleeding lasting 7 and 5 days respectively. . She denies however, dyspareunia, vaginitis symptoms,  or changes in bowel or bladder function. Though medical options were reviewed, she wishes to proceed with an endometrial ablation since she has undergone tubal sterilization.       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    ROS:  Denies headache, vision changes, nasal congestion, dysphagia, tinnitus, dizziness, hoarseness, cough,  chest pain, shortness of breath, nausea, vomiting, diarrhea,constipation,  urinary  frequency, urgency  dysuria, hematuria, vaginitis symptoms, pelvic pain, swelling of joints,easy bruising,  myalgias, arthralgias, skin rashes, unexplained weight loss and except as is mentioned in the history of present illness, patient's review of systems is otherwise negative.    Physical Exam  Bp: 110/82    Ht 5\' 4"  (1.626 m)   Wt 75.3 kg   LMP 11/25/2022 (Exact Date)   BMI 28.49 kg/m   Neck: supple without masses or thyromegaly Lungs: clear to auscultation Heart: regular rate and rhythm Abdomen: soft, non-tender and no organomegaly Pelvic:EGBUS- wnl; vagina-normal rugae; uterus-normal size, cervix without lesions or motion tenderness; adnexae-no tenderness or masses Extremities:  no clubbing, cyanosis or edema   Assesment: Menorrhagia                      Intermenstrual Bleeding   Disposition:  A discussion was held with patient regarding the indication for her procedure(s) along with the risks, which include but are not limited to: reaction to anesthesia, damage to adjacent organs, infection and excessive bleeding.  Benefits include: 94 % success with approximately 30 % amenorrhea and fewer risks than intra-abdominal surgery. Also discussed: return of cycle even after a period of amenorrhea, possible pelvic pain with uterine synechia, 6% failure to address menorrhagia. The patient verbalized understanding of these risks and has consented to proceed with a Hysteroscopy with Novasure Endometrial Ablation at [facility] on December 20, 2022.   CSN# [id number]   Brandom Kerwin J. Florene Glen, PA-C  for Dr. Dede Query. Rivard

## 2022-12-20 ENCOUNTER — Ambulatory Visit (HOSPITAL_BASED_OUTPATIENT_CLINIC_OR_DEPARTMENT_OTHER): Payer: Medicaid Other | Admitting: Anesthesiology

## 2022-12-20 ENCOUNTER — Ambulatory Visit (HOSPITAL_BASED_OUTPATIENT_CLINIC_OR_DEPARTMENT_OTHER)
Admission: RE | Admit: 2022-12-20 | Discharge: 2022-12-20 | Disposition: A | Discharge status: Home / Self Care | Payer: Medicaid Other | Attending: Obstetrics and Gynecology | Admitting: Obstetrics and Gynecology

## 2022-12-20 ENCOUNTER — Encounter (HOSPITAL_BASED_OUTPATIENT_CLINIC_OR_DEPARTMENT_OTHER): Payer: Self-pay | Admitting: Obstetrics and Gynecology

## 2022-12-20 ENCOUNTER — Encounter (HOSPITAL_BASED_OUTPATIENT_CLINIC_OR_DEPARTMENT_OTHER)
Admission: RE | Disposition: A | Discharge status: Home / Self Care | Payer: Self-pay | Source: Home / Self Care | Attending: Obstetrics and Gynecology

## 2022-12-20 ENCOUNTER — Other Ambulatory Visit: Payer: Self-pay

## 2022-12-20 DIAGNOSIS — F17200 Nicotine dependence, unspecified, uncomplicated: Secondary | ICD-10-CM | POA: Insufficient documentation

## 2022-12-20 DIAGNOSIS — I10 Essential (primary) hypertension: Secondary | ICD-10-CM | POA: Insufficient documentation

## 2022-12-20 DIAGNOSIS — N939 Abnormal uterine and vaginal bleeding, unspecified: Secondary | ICD-10-CM

## 2022-12-20 DIAGNOSIS — N938 Other specified abnormal uterine and vaginal bleeding: Secondary | ICD-10-CM | POA: Diagnosis present

## 2022-12-20 DIAGNOSIS — Z79899 Other long term (current) drug therapy: Secondary | ICD-10-CM | POA: Insufficient documentation

## 2022-12-20 DIAGNOSIS — N870 Mild cervical dysplasia: Secondary | ICD-10-CM

## 2022-12-20 HISTORY — PX: HYSTEROSCOPY WITH NOVASURE: SHX5574

## 2022-12-20 LAB — CBC
HCT: 44.3 % (ref 36.0–46.0)
Hemoglobin: 15 g/dL (ref 12.0–15.0)
MCH: 29.6 pg (ref 26.0–34.0)
MCHC: 33.9 g/dL (ref 30.0–36.0)
MCV: 87.4 fL (ref 80.0–100.0)
Platelets: 249 10*3/uL (ref 150–400)
RBC: 5.07 MIL/uL (ref 3.87–5.11)
RDW: 12.2 % (ref 11.5–15.5)
WBC: 5.3 10*3/uL (ref 4.0–10.5)
nRBC: 0 % (ref 0.0–0.2)

## 2022-12-20 LAB — TYPE AND SCREEN
ABO/RH(D): O POS
Antibody Screen: NEGATIVE

## 2022-12-20 LAB — POCT PREGNANCY, URINE: Preg Test, Ur: NEGATIVE

## 2022-12-20 SURGERY — HYSTEROSCOPY WITH NOVASURE
Anesthesia: General | Site: Vagina

## 2022-12-20 MED ORDER — ENSURE PRE-SURGERY PO LIQD: 296.0000 mL | Freq: Once | ORAL | Status: DC

## 2022-12-20 MED ORDER — PROPOFOL 10 MG/ML IV BOLUS
INTRAVENOUS | Status: DC | PRN
  Administered 2022-12-20: 200 mg via INTRAVENOUS

## 2022-12-20 MED ORDER — OXYCODONE HCL 5 MG PO TABS: 5.0000 mg | ORAL_TABLET | ORAL | 0 refills | Status: DC | PRN

## 2022-12-20 MED ORDER — ONDANSETRON HCL 4 MG/2ML IJ SOLN
INTRAMUSCULAR | Status: DC | PRN
  Administered 2022-12-20: 4 mg via INTRAVENOUS

## 2022-12-20 MED ORDER — CEFAZOLIN SODIUM-DEXTROSE 2-4 GM/100ML-% IV SOLN
INTRAVENOUS | Status: AC
  Filled 2022-12-20: qty 100

## 2022-12-20 MED ORDER — PROMETHAZINE HCL 25 MG/ML IJ SOLN: 6.2500 mg | INTRAMUSCULAR | Status: DC | PRN

## 2022-12-20 MED ORDER — FENTANYL CITRATE (PF) 100 MCG/2ML IJ SOLN
INTRAMUSCULAR | Status: DC | PRN
  Administered 2022-12-20 (×4): 25 ug via INTRAVENOUS

## 2022-12-20 MED ORDER — CHLOROPROCAINE HCL 1 % IJ SOLN
INTRAMUSCULAR | Status: DC | PRN
  Administered 2022-12-20: 10 mL

## 2022-12-20 MED ORDER — GABAPENTIN 300 MG PO CAPS
300.0000 mg | ORAL_CAPSULE | ORAL | Status: AC
  Administered 2022-12-20: 300 mg via ORAL

## 2022-12-20 MED ORDER — DEXAMETHASONE SODIUM PHOSPHATE 4 MG/ML IJ SOLN
INTRAMUSCULAR | Status: DC | PRN
  Administered 2022-12-20: 5 mg via INTRAVENOUS

## 2022-12-20 MED ORDER — CEFAZOLIN SODIUM 1 G IJ SOLR
INTRAMUSCULAR | Status: AC
  Filled 2022-12-20: qty 20

## 2022-12-20 MED ORDER — ACETAMINOPHEN 500 MG PO TABS: 1000.0000 mg | ORAL_TABLET | Freq: Four times a day (QID) | ORAL | 0 refills | Status: DC | PRN

## 2022-12-20 MED ORDER — MIDAZOLAM HCL 5 MG/5ML IJ SOLN
INTRAMUSCULAR | Status: DC | PRN
  Administered 2022-12-20: 2 mg via INTRAVENOUS

## 2022-12-20 MED ORDER — OXYCODONE HCL 5 MG PO TABS: 5.0000 mg | ORAL_TABLET | Freq: Once | ORAL | Status: DC | PRN

## 2022-12-20 MED ORDER — CEFAZOLIN SODIUM-DEXTROSE 2-3 GM-%(50ML) IV SOLR
INTRAVENOUS | Status: DC | PRN
  Administered 2022-12-20: 2 g via INTRAVENOUS

## 2022-12-20 MED ORDER — LIDOCAINE 2% (20 MG/ML) 5 ML SYRINGE
INTRAMUSCULAR | Status: DC | PRN
  Administered 2022-12-20: 60 mg via INTRAVENOUS

## 2022-12-20 MED ORDER — PROPOFOL 10 MG/ML IV BOLUS
INTRAVENOUS | Status: AC
  Filled 2022-12-20: qty 20

## 2022-12-20 MED ORDER — GLYCOPYRROLATE PF 0.2 MG/ML IJ SOSY
PREFILLED_SYRINGE | INTRAMUSCULAR | Status: AC
  Filled 2022-12-20: qty 1

## 2022-12-20 MED ORDER — MEPERIDINE HCL 25 MG/ML IJ SOLN: 6.2500 mg | INTRAMUSCULAR | Status: DC | PRN

## 2022-12-20 MED ORDER — ACETAMINOPHEN 500 MG PO TABS
1000.0000 mg | ORAL_TABLET | ORAL | Status: AC
  Administered 2022-12-20: 1000 mg via ORAL

## 2022-12-20 MED ORDER — POVIDONE-IODINE 10 % EX SWAB: 2.0000 | Freq: Once | CUTANEOUS | Status: DC

## 2022-12-20 MED ORDER — CELECOXIB 200 MG PO CAPS
400.0000 mg | ORAL_CAPSULE | ORAL | Status: AC
  Administered 2022-12-20: 400 mg via ORAL

## 2022-12-20 MED ORDER — SCOPOLAMINE 1 MG/3DAYS TD PT72
MEDICATED_PATCH | TRANSDERMAL | Status: AC
  Filled 2022-12-20: qty 1

## 2022-12-20 MED ORDER — ACETAMINOPHEN 500 MG PO TABS
ORAL_TABLET | ORAL | Status: AC
  Filled 2022-12-20: qty 2

## 2022-12-20 MED ORDER — HYDROMORPHONE HCL 1 MG/ML IJ SOLN
0.2500 mg | INTRAMUSCULAR | Status: DC | PRN
  Administered 2022-12-20: 0.5 mg via INTRAVENOUS

## 2022-12-20 MED ORDER — DEXAMETHASONE SODIUM PHOSPHATE 10 MG/ML IJ SOLN
INTRAMUSCULAR | Status: AC
  Filled 2022-12-20: qty 1

## 2022-12-20 MED ORDER — FENTANYL CITRATE (PF) 100 MCG/2ML IJ SOLN
INTRAMUSCULAR | Status: AC
  Filled 2022-12-20: qty 2

## 2022-12-20 MED ORDER — HYDROMORPHONE HCL 1 MG/ML IJ SOLN
INTRAMUSCULAR | Status: AC
  Filled 2022-12-20: qty 1

## 2022-12-20 MED ORDER — GABAPENTIN 300 MG PO CAPS
ORAL_CAPSULE | ORAL | Status: AC
  Filled 2022-12-20: qty 1

## 2022-12-20 MED ORDER — SODIUM CHLORIDE 0.9 % IR SOLN
Status: DC | PRN
  Administered 2022-12-20: 1500 mL

## 2022-12-20 MED ORDER — ONDANSETRON HCL 4 MG/2ML IJ SOLN
INTRAMUSCULAR | Status: AC
  Filled 2022-12-20: qty 2

## 2022-12-20 MED ORDER — AMISULPRIDE (ANTIEMETIC) 5 MG/2ML IV SOLN: 10.0000 mg | Freq: Once | INTRAVENOUS | Status: DC | PRN

## 2022-12-20 MED ORDER — LIDOCAINE HCL (PF) 2 % IJ SOLN
INTRAMUSCULAR | Status: AC
  Filled 2022-12-20: qty 5

## 2022-12-20 MED ORDER — MIDAZOLAM HCL 2 MG/2ML IJ SOLN
INTRAMUSCULAR | Status: AC
  Filled 2022-12-20: qty 2

## 2022-12-20 MED ORDER — SCOPOLAMINE 1 MG/3DAYS TD PT72
1.0000 | MEDICATED_PATCH | TRANSDERMAL | Status: DC
  Administered 2022-12-20: 1.5 mg via TRANSDERMAL

## 2022-12-20 MED ORDER — CELECOXIB 200 MG PO CAPS
ORAL_CAPSULE | ORAL | Status: AC
  Filled 2022-12-20: qty 2

## 2022-12-20 MED ORDER — OXYCODONE HCL 5 MG/5ML PO SOLN: 5.0000 mg | Freq: Once | ORAL | Status: DC | PRN

## 2022-12-20 MED ORDER — LACTATED RINGERS IV SOLN: INTRAVENOUS | Status: DC

## 2022-12-20 MED ORDER — GLYCOPYRROLATE 0.2 MG/ML IJ SOLN
INTRAMUSCULAR | Status: DC | PRN
  Administered 2022-12-20: .2 mg via INTRAVENOUS

## 2022-12-20 SURGICAL SUPPLY — 21 items
ABLATOR SURESOUND NOVASURE (ABLATOR) ×1 IMPLANT
CATH ROBINSON RED A/P 16FR (CATHETERS) ×1 IMPLANT
CLOTH BEACON ORANGE TIMEOUT ST (SAFETY) ×1 IMPLANT
DILATOR CANAL MILEX (MISCELLANEOUS) IMPLANT
DRSG TELFA 3X8 NADH STRL (GAUZE/BANDAGES/DRESSINGS) ×1 IMPLANT
ELECT REM PT RETURN 9FT ADLT (ELECTROSURGICAL)
ELECTRODE REM PT RTRN 9FT ADLT (ELECTROSURGICAL) ×1 IMPLANT
GAUZE 4X4 16PLY ~~LOC~~+RFID DBL (SPONGE) ×1 IMPLANT
GLOVE BIOGEL PI IND STRL 7.0 (GLOVE) ×1 IMPLANT
GLOVE ECLIPSE 6.5 STRL STRAW (GLOVE) ×2 IMPLANT
GOWN STRL REUS W/TWL LRG LVL3 (GOWN DISPOSABLE) ×1 IMPLANT
IV NS IRRIG 3000ML ARTHROMATIC (IV SOLUTION) ×1 IMPLANT
KIT PROCEDURE FLUENT (KITS) ×1 IMPLANT
KIT TURNOVER CYSTO (KITS) ×1 IMPLANT
PACK VAGINAL MINOR WOMEN LF (CUSTOM PROCEDURE TRAY) ×1 IMPLANT
PAD OB MATERNITY 4.3X12.25 (PERSONAL CARE ITEMS) ×1 IMPLANT
PAD PREP 24X48 CUFFED NSTRL (MISCELLANEOUS) ×1 IMPLANT
SEAL ROD LENS SCOPE MYOSURE (ABLATOR) ×1 IMPLANT
SLEEVE SCD COMPRESS KNEE MED (STOCKING) ×1 IMPLANT
TOWEL OR 17X24 6PK STRL BLUE (TOWEL DISPOSABLE) ×2 IMPLANT
WATER STERILE IRR 500ML POUR (IV SOLUTION) ×1 IMPLANT

## 2022-12-20 NOTE — Op Note (Signed)
Preop diagnosis: Abnormal uterine bleeding  Postop diagnosis: same  Anesthesia: IV sedation with LMA  Anesthesiologist: Dr. Hyacinth Meeker  Procedure: Hysteroscopy, D&C and Novasure endometrial ablation  Surgeon: Dr. Dois Davenport Emoni Whitworth   After being informed of the planned procedure with possible complications including bleeding, infection and uterine perforation, informed consent was obtained and patient was taken to or #5.  She was given IV anesthesia without complication. She was placed in a dorsal decubitus position, prepped and draped in the sterile fashion and a red rubber catheter was used to empty her bladder.   Pelvic exam reveals:anteverted uterus with 2 normal adnexa  A weighted speculum is inserted in the vagina. The cervix was grasped with a tenaculum forcep placed on the anterior lip.We proceed with a paracervical block using 1% Nesacaine, 10 cc. Uterus is sounded at 9.5 cm. The cervix is then easily dilated using Hegar dilator until # 29. This allows for easy placement of a diagnostic hysteroscope. With perfusion of Normal saline at a maximum pressure of 90 mmHg, we are able to evaluate the entire uterine cavity.  Observation: normal appearing endometrium with 2 ostia visualized  We then removed our instrumentation. Using a sharp curette, we proceed with curettage of the endometrial cavity which returns a small amount of normal-appearing endometrium.  The Novasure device was easily inserted in the uterine cavity and deployed.  Cavity length:  6.5 cm Cavity width: 3.8 cm Power: 136 Watts  The diagnostic hysteroscope was reinserted to visualize a completely blanched and intact cavity.  Instruments are then removed. Instrument and sponge count is complete x2. Estimated blood loss is minimal. Water deficit is 75 cc of NS.  The procedure is very well tolerated by the patient who is taken to recovery room in a well and stable condition.  Specimen:  endometrial curettings sent to  pathology.

## 2022-12-20 NOTE — Interval H&P Note (Signed)
History and Physical Interval Note:  12/20/2022 10:14 AM  Donna [name]  has presented today for surgery, with the diagnosis of ABNORMAL UTERINE BLEEDING.  The various methods of treatment have been discussed with the patient and family. After consideration of risks, benefits and other options for treatment, the patient has consented to  Procedure(s): HYSTEROSCOPY WITH NOVASURE (N/A) as a surgical intervention.  The patient's history has been reviewed, patient examined, no change in status, stable for surgery.  I have reviewed the patient's chart and labs.  Questions were answered to the patient's satisfaction.     [name]

## 2022-12-20 NOTE — Transfer of Care (Signed)
Immediate Anesthesia Transfer of Care Note  Patient: Donna [name]  Procedure(s) Performed: Procedure(s) (LRB): DILATATION AND CURETTAGE; HYSTEROSCOPY WITH NOVASURE (N/A)  Patient Location: PACU  Anesthesia Type: General  Level of Consciousness: awake, sedated, patient cooperative and responds to stimulation  Airway & Oxygen Therapy: Patient Spontanous Breathing and Patient connected to [place] oxygen  Post-op Assessment: Report given to PACU RN, Post -op Vital signs reviewed and stable and Patient moving all extremities  Post vital signs: Reviewed and stable  Complications: No apparent anesthesia complications

## 2022-12-20 NOTE — Anesthesia Preprocedure Evaluation (Signed)
Anesthesia Evaluation  Patient identified by MRN, date of birth, ID band Patient awake    Reviewed: Allergy & Precautions, NPO status , Patient's Chart, lab work & pertinent test results  Airway Mallampati: II  TM Distance: >3 FB Neck ROM: Full    Dental  (+) Teeth Intact   Pulmonary neg pulmonary ROS, Current Smoker and Patient abstained from smoking.   breath sounds clear to auscultation       Cardiovascular hypertension, Pt. on medications  Rhythm:Regular Rate:Normal     Neuro/Psych  Headaches  negative psych ROS   GI/Hepatic negative GI ROS, Neg liver ROS,,,  Endo/Other  negative endocrine ROS    Renal/GU negative Renal ROS  negative genitourinary   Musculoskeletal negative musculoskeletal ROS (+)    Abdominal   Peds negative pediatric ROS (+)  Hematology negative hematology ROS (+)   Anesthesia Other Findings   Reproductive/Obstetrics                             Lab Results  Component Value Date   WBC 15.1 (H) 02/27/2016   HGB 11.3 (L) 02/27/2016   HCT 34.2 (L) 02/27/2016   MCV 82.2 02/27/2016   PLT 166 02/27/2016   No results found for: "INR", "PROTIME"   Anesthesia Physical Anesthesia Plan  ASA: II  Anesthesia Plan: General   Post-op Pain Management: Tylenol PO (pre-op)*, Celebrex PO (pre-op)* and Gabapentin PO (pre-op)*   Induction: Intravenous  PONV Risk Score and Plan: 2 and Ondansetron, Midazolam and Treatment may vary due to age or medical condition  Airway Management Planned: LMA  Additional Equipment:   Intra-op Plan:   Post-operative Plan:   Informed Consent: I have reviewed the patients History and Physical, chart, labs and discussed the procedure including the risks, benefits and alternatives for the proposed anesthesia with the patient or authorized representative who has indicated his/her understanding and acceptance.       Plan Discussed with:    Anesthesia Plan Comments:         Anesthesia Quick Evaluation

## 2022-12-20 NOTE — Discharge Instructions (Addendum)
POST-OPERATIVE INSTRUCTIONS TO PATIENT  Call [facility] at [phone]  for excessive pain, bleeding or temperature greater than or equal to 100.4 degrees (orally).    No driving for 24 hours No sexual activity for 1 week  Pain management:  Use Ibuprofen 600 mg with Acetaminophen 1000 mg every 6 hours for 2 days and then as needed. se Oxycodone every 4-6 hours as needed if pain is not well controlled with Ibuprofen-Acetaminophen combination. Use your pain medication as needed to maintain a pain level at or below 3/10 Use Colace 1-2 capsules per day as long as you are using pain medication to avoid constipation.       Diet: normal  Bathing: may shower day after surgery  Return to Dr. Estanislado Pandy on 01/03/23 at 9:00 am   Silverio Lay MD  DISCHARGE INSTRUCTIONS: HYSTEROSCOPY / ENDOMETRIAL ABLATION The following instructions have been prepared to help you care for yourself upon your return home.  May take stool softner while taking narcotic pain medication to prevent constipation.  Drink plenty of water.  Personal hygiene:  Use sanitary pads for vaginal drainage, not tampons.  Shower the day after your procedure.  NO tub baths, pools or Jacuzzis for 2-3 weeks.  Wipe front to back after using the bathroom.  Activity and limitations:  Do NOT drive or operate any equipment for 24 hours. The effects of anesthesia are still present and drowsiness may result.  Do NOT rest in bed all day.  Walking is encouraged.  Walk up and down stairs slowly.  You may resume your normal activity in one to two days or as indicated by your physician. Sexual activity: NO intercourse for at least 2 weeks after the procedure, or as indicated by your Doctor.  Diet: Eat a light meal as desired this evening. You may resume your usual diet tomorrow.  Return to Work: You may resume your work activities in one to two days or as indicated by Therapist, sports.  What to expect after your surgery: Expect to  have vaginal bleeding/discharge for 2-3 days and spotting for up to 10 days. It is not unusual to have soreness for up to 1-2 weeks. You may have a slight burning sensation when you urinate for the first day. Mild cramps may continue for a couple of days. You may have a regular period in 2-6 weeks.  Call your doctor for any of the following:  Excessive vaginal bleeding or clotting, saturating and changing one pad every hour.  Inability to urinate 6 hours after discharge from hospital.  Pain not relieved by pain medication.  Fever of 100.4 F or greater.  Unusual vaginal discharge or odor.   Post Anesthesia Home Care Instructions  Activity: Get plenty of rest for the remainder of the day. A responsible individual must stay with you for 24 hours following the procedure.  For the next 24 hours, DO NOT: -Drive a car -Advertising copywriter -Drink alcoholic beverages -Take any medication unless instructed by your physician -Make any legal decisions or sign important papers.  Meals: Start with liquid foods such as gelatin or soup. Progress to regular foods as tolerated. Avoid greasy, spicy, heavy foods. If nausea and/or vomiting occur, drink only clear liquids until the nausea and/or vomiting subsides. Call your physician if vomiting continues.  Special Instructions/Symptoms: Your throat may feel dry or sore from the anesthesia or the breathing tube placed in your throat during surgery. If this causes discomfort, gargle with warm salt water. The discomfort should disappear  within 24 hours.  If you had a scopolamine patch placed behind your left ear for the management of post- operative nausea and/or vomiting:  1. The medication in the patch is effective for 72 hours, after which it should be removed (Sunday 4/13).  Wrap patch in a tissue and discard in the trash. Wash hands thoroughly with soap and water. 2. You may remove the patch earlier than 72 hours if you experience unpleasant side  effects which may include dry mouth, dizziness or visual disturbances. 3. Avoid touching the patch. Wash your hands with soap and water after contact with the patch.    No ibuprofen, Advil, Aleve, Motrin, ketorolac, meloxicam, naproxen, or other NSAIDS until after 4 pm today if needed. No acetaminophen/Tylenol until after 4 pm today if needed.

## 2022-12-20 NOTE — Anesthesia Postprocedure Evaluation (Signed)
Anesthesia Post Note  Patient: Donna [name]  Procedure(s) Performed: DILATATION AND CURETTAGE; HYSTEROSCOPY WITH NOVASURE (Vagina )     Patient location during evaluation: PACU Anesthesia Type: General Level of consciousness: awake and alert Pain management: pain level controlled Vital Signs Assessment: post-procedure vital signs reviewed and stable Respiratory status: spontaneous breathing, nonlabored ventilation and respiratory function stable Cardiovascular status: blood pressure returned to baseline and stable Postop Assessment: no apparent nausea or vomiting Anesthetic complications: no   No notable events documented.  Last Vitals:  Vitals:   12/20/22 1200 12/20/22 1230  BP: 108/71 (!) 117/93  Pulse: 64 67  Resp: 12 16  Temp: 36.5 C 36.5 C  SpO2: 100% 97%    Last Pain:  Vitals:   12/20/22 1200  TempSrc:   PainSc: 3                  Lowella Curb

## 2022-12-20 NOTE — Anesthesia Procedure Notes (Signed)
Procedure Name: LMA Insertion Date/Time: 12/20/2022 10:46 AM  Performed by: Jessica Priest, CRNAPre-anesthesia Checklist: Patient identified, Emergency Drugs available, Suction available, Patient being monitored and Timeout performed Patient Re-evaluated:Patient Re-evaluated prior to induction Oxygen Delivery Method: Circle system utilized Preoxygenation: Pre-oxygenation with 100% oxygen Induction Type: IV induction Ventilation: Mask ventilation without difficulty LMA: LMA inserted LMA Size: 4.0 Number of attempts: 1 Airway Equipment and Method: Bite block Placement Confirmation: positive ETCO2, breath sounds checked- equal and bilateral and CO2 detector Tube secured with: Tape Dental Injury: Teeth and Oropharynx as per pre-operative assessment

## 2022-12-21 ENCOUNTER — Encounter (HOSPITAL_BASED_OUTPATIENT_CLINIC_OR_DEPARTMENT_OTHER): Payer: Self-pay | Admitting: Obstetrics and Gynecology

## 2022-12-21 LAB — SURGICAL PATHOLOGY

## 2023-10-23 NOTE — H&P (Signed)
 Donna [name] is a 38 y.o. 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 The patient gives a several year history of heavy menstrual bleeding that was not responsive to medical therapy and led her to undergo a Novasure  endometrial ablation December 21, 2022.  This procedure rendered her amenorrheic and without any symptoms until 6 months later when she developed almost daily contraction-like pelvic pain that occurred without provocation and was minimally relieved with Ibuprofen 800 mg. She was seen in our office January 2025 and was found to have a hematometra by ultrasound of which 20 cc were evacuated. The patient was given a seven day course of Doxycycline however she discontinued due to side effects and was prescribed, Metronidazole that she elected not to take. A follow up ultrasound on January 14th showed: uterine volume-145 cc with a 3 mm endometrium containing simple fluid in the lower uterine segment, layered debris with fluid containing a hyperechoic and complex cystic avascular component in the fundal portion of the endometrium. The patient continued to report pain, though markedly less that her initial discomfort.. Another pelvic ultrasound,  February 4th revealed: an anteverted uterus (volume = 172 cc): 9.26 x 6.81 x 5.20 cm, endometrium: 39 mm that was filled with hyperechoic debris; both ovaries appeared normal, though her right adnexa contained a cystic tubular structure with thickened walls (superior-lateral to right ovary); and a thin anechoic linear structure from tubal area, within right cornua of uterus. The patient was then placed on a course of Zithromax 500 mg. She denies any fever, changes in bowel or bladder function or vaginitis symptoms. A review of medical and surgical management options were given to the patient, to include: estrogen suppression with a Gnrh agonist/antagonist, hysteroscopic  evacuation with post operative balloon placement or hysterectomy. After careful consideration, however,  the patient has chosen to proceed with definitive therapy in the form of hysterectomy.    Past Medical History  OB History:  G:3;  P: 3-0-0-3: 2010 C-section, 2014 Vaginal and 2017 C-section  (vaginal delivery 7 lbs.) i GYN History: menarche: 38 YO;  LMP: 11/25/2022 due to endometrial ablation;    Contraception: bilateral tubal ligation  The patient reports a past history of: herpes.  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   Per patient, she is able to take Percocet  Denies sensitivity to peanuts, shellfish, soy, latex or adhesives.   ROS: Admits to glasses/contact lenses, pelvic pain, sluggish bowel function but denies headache, vision changes, nasal congestion, dysphagia, tinnitus, dizziness, hoarseness, cough,  chest pain, shortness of breath, nausea, vomiting, diarrhea, urinary frequency, urgency  dysuria, hematuria, vaginitis symptoms, swelling of joints,easy bruising,  myalgias, arthralgias, skin rashes, unexplained weight  loss and except as is mentioned in the history of present  illness, patient's review of systems is otherwise negative.    Physical Exam  Bp:  118/84;   Weight: 163.8;   Height: 5'5";      BMI:27.3  Neck: supple without masses or thyromegaly Lungs: clear to auscultation Heart: regular rate and rhythm Abdomen: soft, non-tender and no organomegaly Pelvic:EGBUS- wnl; vagina-normal rugae; uterus-normal size, cervix without lesions or motion tenderness; adnexae-no tenderness or masses Extremities:  no clubbing, cyanosis or edema   Assesment: Post Endometrial Ablation Syndrome          Chronic Pelvic Pain                      Hematometra                      S/P Endometrial Ablation    Disposition:  A discussion was held with patient regarding the indication for her procedure(s) along with the risks and benefits which include but are not limited to:    1.Benefits of the robotic approach include lesser postoperative pain, less blood loss during surgery, reduced risk of injury to other organs due to better visualization with a 3-D HD 10 times magnifying camera, shorter hospital stay between 0-1 night and rapid recovery with return to daily routine in 2-3 weeks.  2. Although robot-assisted hysterectomy has a longer operative time than traditional laparotomy, in a patient with good medical history, the benefits usually outweigh the risks.  3.Risks include bleeding, infection, injury to other organs, need for laparotomy, transient post-operative facial edema, increased risk of pelvic prolapse associated with any hysterectomy  3. Preservation of healthy ovaries recommended for cardiovascular protection  4. Removal of tubes recommended for ovarian cancer risk reduction  5. Post-operative restrictions discussed: no driving for 2 weeks, no lifting for 6 weeks with weight limit up to 15 lbs. nothing in the vagina for 6 weeks The patient verbalized understanding of these risks and has consented to proceed with a Robot Assisted Total Laparoscopic Hysterectomy  with Bilateral Salpingectomy at [facility] on November 14, 2023 @ 8 a.m.    CSN# [id number]   Deforest Maiden J. Lowell Guitar, PA-C  for Dr. Crist Fat. Rivard

## 2023-11-07 ENCOUNTER — Encounter (HOSPITAL_COMMUNITY): Payer: Self-pay | Admitting: Obstetrics and Gynecology

## 2023-11-08 ENCOUNTER — Encounter (HOSPITAL_COMMUNITY): Payer: Self-pay | Admitting: Obstetrics and Gynecology

## 2023-11-08 NOTE — Progress Notes (Addendum)
 Spoke w/ via phone for pre-op interview--- pt Lab needs dos----    urine preg     Lab results------ lab appt 11-11-2023 @ 1300 getting CBC/ T&S COVID test -----patient states asymptomatic no test needed Arrive at ------- 0600 on 11-14-2023 NPO after MN NO Solid Food.  Clear liquids from MN until--- 0500 Pre-Surgery Ensure or G2: ensure  x2  Med rec completed Medications to take morning of surgery ----- none Diabetic medication ----- n/a  GLP1 agonist last dose: n/a GLP1 instructions:  Patient instructed no nail polish to be worn day of surgery Patient instructed to bring photo id and insurance card day of surgery Patient aware to have Driver (ride ) / caregiver    for 24 hours after surgery -  mother, Nicholos Johns Patient Special Instructions ----- will pick bag w/ CHG/ drinks/ and written instructions at lab appt Pre-Op special Instructions -----    Patient verbalized understanding of instructions that were given at this phone interview. Patient denies chest pain, sob, fever, cough at the interview.    Anesthesia review:  no;  no sig history On 11-11-2023 calling left message with Henreitta Leber PA to clarify ensure drink order since only two drink order.  [place] called back and stated to have patient drink both drink night before surgery and just the clear liquids up to 3 hours to surgery.  ERAS protocol per order.

## 2023-11-11 ENCOUNTER — Encounter (HOSPITAL_COMMUNITY): Admission: RE | Admit: 2023-11-11 | Payer: Medicaid Other | Source: Ambulatory Visit

## 2023-11-11 DIAGNOSIS — Z01812 Encounter for preprocedural laboratory examination: Secondary | ICD-10-CM | POA: Insufficient documentation

## 2023-11-11 DIAGNOSIS — N9985 Post endometrial ablation syndrome: Secondary | ICD-10-CM | POA: Insufficient documentation

## 2023-11-11 DIAGNOSIS — G8929 Other chronic pain: Secondary | ICD-10-CM | POA: Diagnosis not present

## 2023-11-11 DIAGNOSIS — Z87891 Personal history of nicotine dependence: Secondary | ICD-10-CM | POA: Diagnosis not present

## 2023-11-11 DIAGNOSIS — N7011 Chronic salpingitis: Secondary | ICD-10-CM | POA: Diagnosis not present

## 2023-11-11 DIAGNOSIS — D259 Leiomyoma of uterus, unspecified: Secondary | ICD-10-CM | POA: Diagnosis not present

## 2023-11-11 DIAGNOSIS — N857 Hematometra: Secondary | ICD-10-CM | POA: Insufficient documentation

## 2023-11-11 DIAGNOSIS — R102 Pelvic and perineal pain: Secondary | ICD-10-CM | POA: Insufficient documentation

## 2023-11-11 LAB — CBC
HCT: 42.6 % (ref 36.0–46.0)
Hemoglobin: 14.5 g/dL (ref 12.0–15.0)
MCH: 30.2 pg (ref 26.0–34.0)
MCHC: 34 g/dL (ref 30.0–36.0)
MCV: 88.8 fL (ref 80.0–100.0)
Platelets: 318 10*3/uL (ref 150–400)
RBC: 4.8 MIL/uL (ref 3.87–5.11)
RDW: 11.7 % (ref 11.5–15.5)
WBC: 6.6 10*3/uL (ref 4.0–10.5)
nRBC: 0 % (ref 0.0–0.2)

## 2023-11-11 LAB — TYPE AND SCREEN
ABO/RH(D): O POS
Antibody Screen: NEGATIVE

## 2023-11-11 NOTE — Pre-Procedure Instructions (Addendum)
 Surgical Instructions   Your procedure is scheduled on :  Thursday,  11-14-2023 . Report to [facility] Main Entrance "A" at 6:00 A.M., then check in with the Admitting office. Any questions or running late day of surgery: call [phone]  Questions prior to your surgery date: call [phone] or [phone], Monday-Friday, 8am-4pm. If you experience any cold or flu symptoms such as cough, fever, chills, shortness of breath, etc. between now and your scheduled surgery, please notify your surgeon office.     Remember: Enhanced Recovery After Surgery Protocol  Drink 2 Pre-surgery Ensure carbohydrates drinks the night before surgery started at 10:00 PM. This must be completed by midnight.            Started at midnight night before surgery Do not eat any food night before your surgery.  Your may have clear liquid diet from midnight night before surgery until 5:00 AM.    You may drink clear liquids until 5:00 AM the morning of your surgery.   Clear liquids allowed are:  Water  Carbonated Beverages (regular or diet)  Clear Tea ( may sweeten, no milk, no honey, etc.)  Black Coffee Only (may sweeten, NO MILK, CREAM OR POWDERED CREAMER of any kind) Sports drinks, like Gatorade. After 5:00 AM  No clear liquids No water ,  candy,  gum,  and  mints.   Take these medicines the morning of surgery with A SIP OF WATER : None   May take these medicines IF NEEDED: None    One week prior to surgery, STOP taking any Aspirin (unless otherwise instructed by your surgeon) Aleve, Naproxen, Ibuprofen, Motrin, Advil, Goody's, BC's, all herbal medications, fish oil, and non-prescription vitamins.                     Do NOT Smoke (Tobacco/Vaping) for 24 hours prior to your procedure.  If you use a CPAP at night, you may bring your mask/headgear for your overnight stay.   You will be asked to remove any contacts, glasses, piercing's, hearing aid's, dentures/partials prior to surgery. Please bring  cases for these items if needed.    Patients discharged the day of surgery will not be allowed to drive home, and someone needs to stay with them for 24 hours.  SURGICAL WAITING ROOM VISITATION Patients may have no more than 2 support people in the waiting area - these visitors may rotate.   Pre-op nurse will coordinate an appropriate time for 1 ADULT support person, who may not rotate, to accompany patient in pre-op.  Children under the age of 66 must have an adult with them who is not the patient and must remain in the main waiting area with an adult.  If the patient needs to stay at the hospital during part of their recovery, the visitor guidelines for inpatient rooms apply.  Please refer to the [facility] website for the visitor guidelines for any additional information.   If you received a COVID test during your pre-op visit  it is requested that you wear a mask when out in public, stay away from anyone that may not be feeling well and notify your surgeon if you develop symptoms. If you have been in contact with anyone that has tested positive in the last 10 days please notify you surgeon.      Pre-operative CHG Bathing Instructions   You can play a key role in reducing the risk of infection after surgery. Your skin needs to be as  free of germs as possible. You can reduce the number of germs on your skin by washing with CHG (chlorhexidine gluconate) soap before surgery. CHG is an antiseptic soap that kills germs and continues to kill germs even after washing.   DO NOT use if you have an allergy to chlorhexidine/CHG or antibacterial soaps. If your skin becomes reddened or irritated, stop using the CHG and notify one Pre-Op nurse day of surgery.  If you have any skin irritations or problems with the surgical soap (CHG), do not use. Please get a bar of gold dial soap or any antibacterial soap and shower followed the instructions below.             TAKE A SHOWER THE NIGHT BEFORE SURGERY  AND THE DAY OF SURGERY    Please keep in mind the following:  DO NOT shave, including legs and underarms, 48 hours prior to surgery.   You may shave your face before/day of surgery.  Place clean sheets on your bed the night before surgery Use a clean washcloth (not used since being washed) for each shower. DO NOT sleep with pet's night before surgery.  CHG Shower Instructions:  Wash your face and private area with normal soap. If you choose to wash your hair, wash first with your normal shampoo.  After you use shampoo/soap, rinse your hair and body thoroughly to remove shampoo/soap residue.  Turn the water OFF and apply half the bottle of CHG soap to a CLEAN washcloth.  Apply CHG soap ONLY FROM YOUR NECK DOWN TO YOUR TOES (washing for 3-5 minutes)  DO NOT use CHG soap on face, private areas, open wounds, or sores.  Pay special attention to the area where your surgery is being performed.  If you are having back surgery, having someone wash your back for you may be helpful. Wait 2 minutes after CHG soap is applied, then you may rinse off the CHG soap.  Pat dry with a clean towel  Put on clean pajamas    Additional instructions for the day of surgery: DO NOT APPLY any lotions, deodorants, cologne/ perfumes or makup Do not wear jewelry/ piercing's/  metal / permanent jewelry must be removed prior to arrival (no plastic piercing's) Do not wear nail polish, gel polish, artificial nails, or any other type of covering on natural nails (fingers and toes) Do not bring valuables to the hospital. [facility] is not responsible for valuables/personal belongings. Put on clean/comfortable clothes.  Please brush your teeth.  Ask your nurse before applying any prescription medications to the skin.

## 2023-11-13 NOTE — Anesthesia Preprocedure Evaluation (Addendum)
 Anesthesia Evaluation  Patient identified by MRN, date of birth, ID band Patient awake    Reviewed: Allergy & Precautions, NPO status , Patient's Chart, lab work & pertinent test results  Airway Mallampati: II  TM Distance: >3 FB Neck ROM: Full    Dental  (+) Teeth Intact, Dental Advisory Given   Pulmonary Current Smoker and Patient abstained from smoking., former smoker 27 pack year history    Pulmonary exam normal breath sounds clear to auscultation       Cardiovascular negative cardio ROS Normal cardiovascular exam Rhythm:Regular Rate:Normal     Neuro/Psych negative neurological ROS  negative psych ROS   GI/Hepatic negative GI ROS, Neg liver ROS,,,  Endo/Other  negative endocrine ROS    Renal/GU negative Renal ROS  negative genitourinary   Musculoskeletal negative musculoskeletal ROS (+)    Abdominal   Peds  Hematology negative hematology ROS (+) Hb 14.5   Anesthesia Other Findings   Reproductive/Obstetrics pelvic pain with hematometria, bilateral hydrosalpinx                             Anesthesia Physical Anesthesia Plan  ASA: 2  Anesthesia Plan: General   Post-op Pain Management: Tylenol PO (pre-op)*, Toradol IV (intra-op)*, Ketamine IV* and Precedex   Induction: Intravenous  PONV Risk Score and Plan: 3 and Ondansetron, Dexamethasone, Midazolam, Treatment may vary due to age or medical condition and Scopolamine patch - Pre-op  Airway Management Planned: Oral ETT  Additional Equipment: None  Intra-op Plan:   Post-operative Plan: Extubation in OR  Informed Consent: I have reviewed the patients History and Physical, chart, labs and discussed the procedure including the risks, benefits and alternatives for the proposed anesthesia with the patient or authorized representative who has indicated his/her understanding and acceptance.     Dental advisory given  Plan  Discussed with: CRNA  Anesthesia Plan Comments: (2nd PIV after induction)       Anesthesia Quick Evaluation

## 2023-11-14 ENCOUNTER — Other Ambulatory Visit: Payer: Self-pay

## 2023-11-14 ENCOUNTER — Encounter (HOSPITAL_COMMUNITY): Payer: Self-pay | Admitting: Obstetrics and Gynecology

## 2023-11-14 ENCOUNTER — Ambulatory Visit (HOSPITAL_COMMUNITY)
Admission: RE | Admit: 2023-11-14 | Discharge: 2023-11-14 | Disposition: A | Discharge status: Home / Self Care | Payer: Medicaid Other | Attending: Obstetrics and Gynecology | Admitting: Obstetrics and Gynecology

## 2023-11-14 ENCOUNTER — Encounter (HOSPITAL_COMMUNITY)
Admission: RE | Disposition: A | Discharge status: Home / Self Care | Payer: Self-pay | Source: Home / Self Care | Attending: Obstetrics and Gynecology

## 2023-11-14 ENCOUNTER — Ambulatory Visit (HOSPITAL_BASED_OUTPATIENT_CLINIC_OR_DEPARTMENT_OTHER): Payer: Self-pay | Admitting: Anesthesiology

## 2023-11-14 ENCOUNTER — Ambulatory Visit (HOSPITAL_COMMUNITY): Payer: Self-pay | Admitting: Anesthesiology

## 2023-11-14 DIAGNOSIS — N9985 Post endometrial ablation syndrome: Secondary | ICD-10-CM

## 2023-11-14 DIAGNOSIS — Z01818 Encounter for other preprocedural examination: Secondary | ICD-10-CM

## 2023-11-14 DIAGNOSIS — N7011 Chronic salpingitis: Secondary | ICD-10-CM

## 2023-11-14 DIAGNOSIS — N857 Hematometra: Secondary | ICD-10-CM | POA: Diagnosis present

## 2023-11-14 DIAGNOSIS — D259 Leiomyoma of uterus, unspecified: Secondary | ICD-10-CM | POA: Insufficient documentation

## 2023-11-14 DIAGNOSIS — Z87891 Personal history of nicotine dependence: Secondary | ICD-10-CM | POA: Insufficient documentation

## 2023-11-14 DIAGNOSIS — G8929 Other chronic pain: Secondary | ICD-10-CM | POA: Insufficient documentation

## 2023-11-14 DIAGNOSIS — R102 Pelvic and perineal pain: Secondary | ICD-10-CM

## 2023-11-14 HISTORY — DX: Hematometra: N85.7

## 2023-11-14 HISTORY — PX: ROBOTIC ASSISTED TOTAL HYSTERECTOMY WITH BILATERAL SALPINGO OOPHERECTOMY: SHX6086

## 2023-11-14 HISTORY — DX: Pelvic and perineal pain unspecified side: R10.20

## 2023-11-14 HISTORY — DX: Personal history of other infectious and parasitic diseases: Z86.19

## 2023-11-14 HISTORY — DX: Presence of spectacles and contact lenses: Z97.3

## 2023-11-14 HISTORY — DX: Pelvic and perineal pain: R10.2

## 2023-11-14 LAB — POCT PREGNANCY, URINE: Preg Test, Ur: NEGATIVE

## 2023-11-14 SURGERY — HYSTERECTOMY, TOTAL, ROBOT-ASSISTED, LAPAROSCOPIC, WITH BILATERAL SALPINGO-OOPHORECTOMY
Anesthesia: General | Laterality: Bilateral

## 2023-11-14 MED ORDER — FENTANYL CITRATE (PF) 250 MCG/5ML IJ SOLN
INTRAMUSCULAR | Status: AC
  Filled 2023-11-14: qty 5

## 2023-11-14 MED ORDER — PROPOFOL 10 MG/ML IV BOLUS
INTRAVENOUS | Status: DC | PRN
  Administered 2023-11-14: 150 mg via INTRAVENOUS

## 2023-11-14 MED ORDER — CELECOXIB 200 MG PO CAPS
ORAL_CAPSULE | ORAL | Status: AC
  Filled 2023-11-14: qty 2

## 2023-11-14 MED ORDER — ONDANSETRON HCL 4 MG/2ML IJ SOLN: 4.0000 mg | Freq: Once | INTRAMUSCULAR | Status: DC | PRN

## 2023-11-14 MED ORDER — DEXMEDETOMIDINE HCL IN NACL 80 MCG/20ML IV SOLN
INTRAVENOUS | Status: DC | PRN
  Administered 2023-11-14 (×2): 8 ug via INTRAVENOUS

## 2023-11-14 MED ORDER — ORAL CARE MOUTH RINSE: 15.0000 mL | Freq: Once | OROMUCOSAL | Status: AC

## 2023-11-14 MED ORDER — STERILE WATER FOR IRRIGATION IR SOLN
Status: DC | PRN
  Administered 2023-11-14: 1000 mL

## 2023-11-14 MED ORDER — PHENYLEPHRINE 80 MCG/ML (10ML) SYRINGE FOR IV PUSH (FOR BLOOD PRESSURE SUPPORT)
PREFILLED_SYRINGE | INTRAVENOUS | Status: AC
  Filled 2023-11-14: qty 10

## 2023-11-14 MED ORDER — KETOROLAC TROMETHAMINE 30 MG/ML IJ SOLN
INTRAMUSCULAR | Status: AC
Start: 2023-11-14 — End: ?
  Filled 2023-11-14: qty 1

## 2023-11-14 MED ORDER — EPHEDRINE 5 MG/ML INJ
INTRAVENOUS | Status: AC
  Filled 2023-11-14: qty 5

## 2023-11-14 MED ORDER — LIDOCAINE 2% (20 MG/ML) 5 ML SYRINGE
INTRAMUSCULAR | Status: AC
  Filled 2023-11-14: qty 5

## 2023-11-14 MED ORDER — FENTANYL CITRATE (PF) 250 MCG/5ML IJ SOLN
INTRAMUSCULAR | Status: DC | PRN
  Administered 2023-11-14 (×2): 50 ug via INTRAVENOUS
  Administered 2023-11-14: 100 ug via INTRAVENOUS
  Administered 2023-11-14: 50 ug via INTRAVENOUS

## 2023-11-14 MED ORDER — CELECOXIB 200 MG PO CAPS
400.0000 mg | ORAL_CAPSULE | ORAL | Status: AC
  Administered 2023-11-14: 400 mg via ORAL

## 2023-11-14 MED ORDER — ONDANSETRON HCL 4 MG/2ML IJ SOLN
INTRAMUSCULAR | Status: AC
  Filled 2023-11-14: qty 2

## 2023-11-14 MED ORDER — SODIUM CHLORIDE (PF) 0.9 % IJ SOLN
INTRAMUSCULAR | Status: DC | PRN
  Administered 2023-11-14: 100 mL via SURGICAL_CAVITY

## 2023-11-14 MED ORDER — CHLORHEXIDINE GLUCONATE 0.12 % MT SOLN
OROMUCOSAL | Status: AC
  Filled 2023-11-14: qty 15

## 2023-11-14 MED ORDER — CEFAZOLIN SODIUM-DEXTROSE 2-4 GM/100ML-% IV SOLN
2.0000 g | INTRAVENOUS | Status: AC
  Administered 2023-11-14 (×2): 2 g via INTRAVENOUS

## 2023-11-14 MED ORDER — HYDROMORPHONE HCL 1 MG/ML IJ SOLN
INTRAMUSCULAR | Status: DC | PRN
  Administered 2023-11-14: .5 mg via INTRAVENOUS

## 2023-11-14 MED ORDER — ACETAMINOPHEN 500 MG PO TABS
1000.0000 mg | ORAL_TABLET | Freq: Four times a day (QID) | ORAL | Status: DC
  Administered 2023-11-14: 1000 mg via ORAL

## 2023-11-14 MED ORDER — SCOPOLAMINE 1 MG/3DAYS TD PT72
MEDICATED_PATCH | TRANSDERMAL | Status: AC
  Filled 2023-11-14: qty 1

## 2023-11-14 MED ORDER — AMISULPRIDE (ANTIEMETIC) 5 MG/2ML IV SOLN: 10.0000 mg | Freq: Once | INTRAVENOUS | Status: DC | PRN

## 2023-11-14 MED ORDER — ONDANSETRON HCL 4 MG/2ML IJ SOLN
INTRAMUSCULAR | Status: DC | PRN
  Administered 2023-11-14: 4 mg via INTRAVENOUS

## 2023-11-14 MED ORDER — ENSURE PRE-SURGERY PO LIQD: 592.0000 mL | Freq: Once | ORAL | Status: DC

## 2023-11-14 MED ORDER — CEFAZOLIN SODIUM 1 G IJ SOLR
INTRAMUSCULAR | Status: AC
  Filled 2023-11-14: qty 20

## 2023-11-14 MED ORDER — CHLORHEXIDINE GLUCONATE 0.12 % MT SOLN
15.0000 mL | Freq: Once | OROMUCOSAL | Status: AC
  Administered 2023-11-14: 15 mL via OROMUCOSAL

## 2023-11-14 MED ORDER — ACETAMINOPHEN 500 MG PO TABS
ORAL_TABLET | ORAL | Status: AC
  Filled 2023-11-14: qty 2

## 2023-11-14 MED ORDER — CEFAZOLIN SODIUM-DEXTROSE 2-4 GM/100ML-% IV SOLN
INTRAVENOUS | Status: AC
  Filled 2023-11-14: qty 100

## 2023-11-14 MED ORDER — MEPERIDINE HCL 25 MG/ML IJ SOLN
6.2500 mg | INTRAMUSCULAR | Status: DC | PRN
Start: 2023-11-14 — End: 2023-11-14

## 2023-11-14 MED ORDER — ROCURONIUM BROMIDE 10 MG/ML (PF) SYRINGE
PREFILLED_SYRINGE | INTRAVENOUS | Status: DC | PRN
  Administered 2023-11-14: 20 mg via INTRAVENOUS
  Administered 2023-11-14: 60 mg via INTRAVENOUS
  Administered 2023-11-14: 20 mg via INTRAVENOUS

## 2023-11-14 MED ORDER — PROPOFOL 10 MG/ML IV BOLUS
INTRAVENOUS | Status: AC
  Filled 2023-11-14: qty 20

## 2023-11-14 MED ORDER — SCOPOLAMINE 1 MG/3DAYS TD PT72
1.0000 | MEDICATED_PATCH | TRANSDERMAL | Status: DC
  Administered 2023-11-14: 1.5 mg via TRANSDERMAL

## 2023-11-14 MED ORDER — POVIDONE-IODINE 10 % EX SWAB
2.0000 | Freq: Once | CUTANEOUS | Status: AC
  Administered 2023-11-14: 2 via TOPICAL

## 2023-11-14 MED ORDER — ROCURONIUM BROMIDE 10 MG/ML (PF) SYRINGE
PREFILLED_SYRINGE | INTRAVENOUS | Status: AC
  Filled 2023-11-14: qty 10

## 2023-11-14 MED ORDER — SODIUM CHLORIDE (PF) 0.9 % IJ SOLN
INTRAMUSCULAR | Status: AC
  Filled 2023-11-14: qty 10

## 2023-11-14 MED ORDER — KETOROLAC TROMETHAMINE 30 MG/ML IJ SOLN: 30.0000 mg | Freq: Once | INTRAMUSCULAR | Status: DC | PRN

## 2023-11-14 MED ORDER — OXYCODONE HCL 5 MG PO TABS: 5.0000 mg | ORAL_TABLET | Freq: Once | ORAL | Status: DC | PRN

## 2023-11-14 MED ORDER — SODIUM CHLORIDE (PF) 0.9 % IJ SOLN
INTRAMUSCULAR | Status: AC
  Filled 2023-11-14: qty 50

## 2023-11-14 MED ORDER — KETOROLAC TROMETHAMINE 30 MG/ML IJ SOLN: 30.0000 mg | Freq: Once | INTRAMUSCULAR | Status: DC

## 2023-11-14 MED ORDER — DEXAMETHASONE SODIUM PHOSPHATE 10 MG/ML IJ SOLN
INTRAMUSCULAR | Status: AC
Start: 2023-11-14 — End: ?
  Filled 2023-11-14: qty 1

## 2023-11-14 MED ORDER — OXYCODONE HCL 5 MG/5ML PO SOLN: 5.0000 mg | Freq: Once | ORAL | Status: DC | PRN

## 2023-11-14 MED ORDER — ACETAMINOPHEN 500 MG PO TABS
1000.0000 mg | ORAL_TABLET | ORAL | Status: AC
  Administered 2023-11-14: 1000 mg via ORAL

## 2023-11-14 MED ORDER — PHENYLEPHRINE 80 MCG/ML (10ML) SYRINGE FOR IV PUSH (FOR BLOOD PRESSURE SUPPORT)
PREFILLED_SYRINGE | INTRAVENOUS | Status: DC | PRN
  Administered 2023-11-14: 160 ug via INTRAVENOUS

## 2023-11-14 MED ORDER — SUGAMMADEX SODIUM 200 MG/2ML IV SOLN
INTRAVENOUS | Status: DC | PRN
  Administered 2023-11-14: 200 mg via INTRAVENOUS

## 2023-11-14 MED ORDER — DEXAMETHASONE SODIUM PHOSPHATE 10 MG/ML IJ SOLN
INTRAMUSCULAR | Status: DC | PRN
  Administered 2023-11-14: 10 mg via INTRAVENOUS

## 2023-11-14 MED ORDER — HYDROMORPHONE HCL 1 MG/ML IJ SOLN: 0.2500 mg | INTRAMUSCULAR | Status: DC | PRN

## 2023-11-14 MED ORDER — LACTATED RINGERS IV SOLN: INTRAVENOUS | Status: DC | PRN

## 2023-11-14 MED ORDER — LACTATED RINGERS IV SOLN: INTRAVENOUS | Status: DC

## 2023-11-14 MED ORDER — HYDROMORPHONE HCL 1 MG/ML IJ SOLN
INTRAMUSCULAR | Status: AC
  Filled 2023-11-14: qty 0.5

## 2023-11-14 MED ORDER — ESMOLOL HCL 100 MG/10ML IV SOLN
INTRAVENOUS | Status: DC | PRN
  Administered 2023-11-14: 10 mg via INTRAVENOUS

## 2023-11-14 MED ORDER — LIDOCAINE 2% (20 MG/ML) 5 ML SYRINGE
INTRAMUSCULAR | Status: DC | PRN
  Administered 2023-11-14: 100 mg via INTRAVENOUS

## 2023-11-14 MED ORDER — SODIUM CHLORIDE 0.9 % IR SOLN
Status: DC | PRN
  Administered 2023-11-14: 1000 mL

## 2023-11-14 MED ORDER — ROPIVACAINE HCL 5 MG/ML IJ SOLN
INTRAMUSCULAR | Status: AC
Start: 2023-11-14 — End: ?
  Filled 2023-11-14: qty 60

## 2023-11-14 SURGICAL SUPPLY — 66 items
BAG COUNTER SPONGE SURGICOUNT (BAG) ×1 IMPLANT
BAG URINE DRAIN 2000ML AR STRL (UROLOGICAL SUPPLIES) IMPLANT
BARRIER ADHS 3X4 INTERCEED (GAUZE/BANDAGES/DRESSINGS) ×1 IMPLANT
CANISTER SUCT 3000ML PPV (MISCELLANEOUS) ×1 IMPLANT
CATH FOLEY 3WAY 5CC 16FR (CATHETERS) ×1 IMPLANT
COVER BACK TABLE 60X90IN (DRAPES) ×1 IMPLANT
COVER TIP SHEARS 8 DVNC (MISCELLANEOUS) ×1 IMPLANT
DEFOGGER SCOPE WARMER CLEARIFY (MISCELLANEOUS) ×1 IMPLANT
DERMABOND ADVANCED .7 DNX12 (GAUZE/BANDAGES/DRESSINGS) ×1 IMPLANT
DILATOR CANAL MILEX (MISCELLANEOUS) IMPLANT
DRAPE ARM DVNC X/XI (DISPOSABLE) ×4 IMPLANT
DRAPE COLUMN DVNC XI (DISPOSABLE) ×1 IMPLANT
DRAPE UTILITY XL STRL (DRAPES) ×1 IMPLANT
DRIVER NDL MEGA SUTCUT DVNCXI (INSTRUMENTS) ×1 IMPLANT
DRIVER NDLE MEGA SUTCUT DVNCXI (INSTRUMENTS) ×1 IMPLANT
DURAPREP 26ML APPLICATOR (WOUND CARE) ×1 IMPLANT
ELECT REM PT RETURN 9FT ADLT (ELECTROSURGICAL) ×1 IMPLANT
ELECTRODE REM PT RTRN 9FT ADLT (ELECTROSURGICAL) ×1 IMPLANT
FORCEPS BPLR LNG DVNC XI (INSTRUMENTS) ×1 IMPLANT
FORCEPS LONG TIP 8 DVNC XI (FORCEP) ×1 IMPLANT
FORCEPS MICRO DMD BLK DVNC XI (FORCEP) ×1 IMPLANT
FORCEPS PROGRASP DVNC XI (FORCEP) ×1 IMPLANT
GAUZE 4X4 16PLY ~~LOC~~+RFID DBL (SPONGE) ×1 IMPLANT
GLOVE BIO SURGEON STRL SZ7 (GLOVE) ×1 IMPLANT
GLOVE BIOGEL PI IND STRL 7.0 (GLOVE) ×5 IMPLANT
GLOVE ECLIPSE 6.5 STRL STRAW (GLOVE) ×3 IMPLANT
HIBICLENS CHG 4% 4OZ BTL (MISCELLANEOUS) ×1 IMPLANT
IRRIGATION STRYKERFLOW (MISCELLANEOUS) ×1 IMPLANT
IRRIGATOR STRYKERFLOW (MISCELLANEOUS) ×1 IMPLANT
KIT PINK PAD W/HEAD ARE REST (MISCELLANEOUS) ×2 IMPLANT
KIT PINK PAD W/HEAD ARM REST (MISCELLANEOUS) ×2 IMPLANT
LEGGING LITHOTOMY PAIR STRL (DRAPES) ×1 IMPLANT
MANIPULATOR UTERINE 4.5 ZUMI (MISCELLANEOUS) IMPLANT
NDL HYPO 22X1.5 SAFETY MO (MISCELLANEOUS) ×1 IMPLANT
NDL SPNL 22GX3.5 QUINCKE BK (NEEDLE) IMPLANT
NEEDLE HYPO 22X1.5 SAFETY MO (MISCELLANEOUS) ×1 IMPLANT
NEEDLE SPNL 22GX3.5 QUINCKE BK (NEEDLE) ×1 IMPLANT
OBTURATOR OPTICAL STND 8 DVNC (TROCAR) ×1 IMPLANT
OBTURATOR OPTICALSTD 8 DVNC (TROCAR) ×1 IMPLANT
OCCLUDER COLPOPNEUMO (BALLOONS) ×1 IMPLANT
PACK ROBOT WH (CUSTOM PROCEDURE TRAY) ×1 IMPLANT
PACK ROBOTIC GOWN (GOWN DISPOSABLE) ×1 IMPLANT
PAD OB MATERNITY 11 LF (PERSONAL CARE ITEMS) ×1 IMPLANT
POUCH LAPAROSCOPIC INSTRUMENT (MISCELLANEOUS) ×1 IMPLANT
PROTECTOR NERVE ULNAR (MISCELLANEOUS) ×3 IMPLANT
SCISSORS MNPLR CVD DVNC XI (INSTRUMENTS) ×1 IMPLANT
SEAL UNIV 5-12 XI (MISCELLANEOUS) ×4 IMPLANT
SEALER VESSEL EXT DVNC XI (MISCELLANEOUS) IMPLANT
SET IRRIG Y TYPE TUR BLADDER L (SET/KITS/TRAYS/PACK) IMPLANT
SET TRI-LUMEN FLTR TB AIRSEAL (TUBING) ×1 IMPLANT
SPIKE FLUID TRANSFER (MISCELLANEOUS) ×2 IMPLANT
STRIP CLOSURE SKIN 1/4X4 (GAUZE/BANDAGES/DRESSINGS) ×1 IMPLANT
SUT MNCRL AB 3-0 PS2 27 (SUTURE) ×2 IMPLANT
SUT VIC AB 0 CT1 27XBRD ANBCTR (SUTURE) ×2 IMPLANT
SUT VIC AB 2-0 UR6 27 (SUTURE) ×2 IMPLANT
SUT VICRYL 0 UR6 27IN ABS (SUTURE) ×1 IMPLANT
SUT VLOC 180 0 9IN GS21 (SUTURE) ×2 IMPLANT
TIP RUMI ORANGE 6.7MMX12CM (TIP) IMPLANT
TIP UTERINE 5.1X6CM LAV DISP (MISCELLANEOUS) IMPLANT
TIP UTERINE 6.7X10CM GRN DISP (MISCELLANEOUS) IMPLANT
TIP UTERINE 6.7X6CM WHT DISP (MISCELLANEOUS) IMPLANT
TIP UTERINE 6.7X8CM BLUE DISP (MISCELLANEOUS) IMPLANT
TOWEL GREEN STERILE (TOWEL DISPOSABLE) ×1 IMPLANT
TROCAR PORT AIRSEAL 8X120 (TROCAR) ×1 IMPLANT
UNDERPAD 30X36 HEAVY ABSORB (UNDERPADS AND DIAPERS) ×1 IMPLANT
WATER STERILE IRR 1000ML POUR (IV SOLUTION) ×1 IMPLANT

## 2023-11-14 NOTE — Discharge Instructions (Addendum)
 Call [facility] at [phone] if:   You have a temperature greater than or equal to 100.4 degrees Farenheit orally You have pain that is not made better by the pain medication given and taken as directed You have excessive bleeding or problems urinating  Take Colace (Docusate Sodium/Stool Softener) 100 mg 2-3 times daily while taking narcotic pain medicine to avoid constipation or until bowel movements are regular.  Take Ibuprofen 600 mg with food and Acetaminophen 500 mg-2 tablets every 6 hours for 5 days then as needed for pain. (you received these medications at your pre-operative visit)  You may drive after 2 weeks. You may walk up steps.  You may shower tomorrow. You may resume a regular diet.  Keep incisions clean and dry. Do not lift over 15 pounds for 6 weeks. Avoid anything in vagina for 6 weeks.    Post Anesthesia Home Care Instructions  Activity: Get plenty of rest for the remainder of the day. A responsible individual must stay with you for 24 hours following the procedure.  For the next 24 hours, DO NOT: -Drive a car -Advertising copywriter -Drink alcoholic beverages -Take any medication unless instructed by your physician -Make any legal decisions or sign important papers.  Meals: Start with liquid foods such as gelatin or soup. Progress to regular foods as tolerated. Avoid greasy, spicy, heavy foods. If nausea and/or vomiting occur, drink only clear liquids until the nausea and/or vomiting subsides. Call your physician if vomiting continues.  Special Instructions/Symptoms: Your throat may feel dry or sore from the anesthesia or the breathing tube placed in your throat during surgery. If this causes discomfort, gargle with warm salt water. The discomfort should disappear within 24 hours.  If you had a scopolamine patch placed behind your ear for the management of post- operative nausea and/or vomiting:  1. The medication in the patch is effective for 72  hours, after which it should be removed.  Wrap patch in a tissue and discard in the trash. Wash hands thoroughly with soap and water. 2. You may remove the patch earlier than 72 hours if you experience unpleasant side effects which may include dry mouth, dizziness or visual disturbances. 3. Avoid touching the patch. Wash your hands with soap and water after contact with the patch.    Remove patch behind left ear by Saturday, November 17, 2023.  HOME CARE INSTRUCTIONS - LAPAROSCOPY  Wound Care: The bandaids or dressing which are placed over the skin openings may be removed the day after surgery. The incision should be kept clean and dry. The stitches do not need to be removed. Should the incision become sore, red, and swollen after the first week, check with your doctor. You have skin glue on your incisions. It will peel off in about 2 weeks. Do not put lotion, creams, or powders on the incisions.  Personal Hygiene: Shower the day after your procedure. Always wipe from front to back after elimination.   Activity: Do not drive or operate any equipment today. The effects of the anesthesia are still present and drowsiness may result. Rest today, not necessarily flat bed rest, just take it easy. You may resume your normal activity in one to three days or as instructed by your physician.  Sexual Activity: You resume sexual activity as indicated by your physician.  Diet: Eat a light diet as desired this evening. You may resume a regular diet tomorrow.  Expectations After Surgery: Your surgery will cause vaginal drainage or spotting which  may continue for 2-3 days. Mild abdominal discomfort or tenderness is not unusual and some shoulder pain may also be noted which can be relieved by lying flat in pain.  Call Your Doctor If these Occur:  Persistent or heavy bleeding at incision site       Redness or swelling around incision       Elevation of temperature greater than 100 degrees F

## 2023-11-14 NOTE — Op Note (Signed)
 Preoperative diagnosis: Pelvic pain with hematometra  Postoperative diagnosis: Same   Anesthesia: General  Anesthesiologist: Dr. Desmond Lope  Procedure: Robotically assisted total hysterectomy with bilateral salpingectomy  Surgeon: Dr. Dois Davenport Jafeth Mustin  Assistant: Henreitta Leber P.A.-C.  Estimated blood loss: 50 cc  Procedure:  After being informed of the planned procedure with possible complications including but not limited to bleeding, infection, injury to other organs, need for laparotomy, expected hospital stay and recovery, informed consent is obtained and patient is taken to or #5. She is placed in  lithotomy position on Pink Pad with both arms padded and tucked on each side and bilateral knee-high sequential compressive devices. She is given general anesthesia with endotracheal intubation without any complication. She is prepped and draped in a sterile fashion. A three-way Foley catheter is inserted in her bladder.  Pelvic exam reveals: Anteverted uterus bulky with 2 normal adnexa   A weighted speculum is inserted in the vagina and the anterior lip of the cervix is grasped with a tenaculum forcep. We proceed with a paracervical block and vaginal infiltration using ropivacaine 0.5% diluted 1 in 1 with saline. The uterus was then sounded at 6 cm. We easily dilate the cervix using Hegar dilator to  #27 which allows for easy placement of the intrauterine ZUMI manipulator with a 3.0 KOH ring and a vaginal occluder. The ring is sutured to the cervix with 0 Vicryl.  Trocar placement is decided. We infiltrate the umbilicus with 10 cc of ropivacaine per protocol and perform a 10 mm semi-elliptical incision which is brought down bluntly to the fascia. The fascia is identified and grasped with Coker forceps. The fascia is incised with Mayo scissors. Peritoneum is entered bluntly. A pursestring suture of 0 Vicryl is placed on the fascia and a 10 mm Hassan trocar is easily inserted in the abdominal cavity  held in placed with a Purstring suture. This allows for easy insufflation of a pneumoperitoneum using warmed CO2 at a maximum pressure of 15 mm of mercury. 60 cc of Ropivacaine 0.5 % diluted 1 in 1 is sent in the pelvis and the patient is positioned in reverse Trendelenburg. We then placed two 8mm robotic trocar on the left, one 8mm robotic trocar on the right and one 8 mm patient's side assistant trocar on the right  after infiltrating every site  with ropivacaine per protocol. The robot is docked on the left of the patient after positioning her in Trendelenburg. A Vessel Seal is inserted in arm #4, a Long bipolar forceps is inserted in arm #2 and a long bipolar is inserted in arm #1.  Preparation and docking is completed in 46 minutes.  Observation: Dense adhesions between the omentum and the uterus, 8 week size uterus, 2 normal ovaries, both tubes are s/p ligation, 2 normal fimbria, 2 hematosalpinx, liver is normal, appendix is not visualized.   After sharp systematic lysis of adhesions, we observe a normal posterior cul-de-sac. The bladder is densely adherent to the mid-body of the uterus.   We start on the right side by sealing and cutting the mesosalpynx , the right utero-ovarian ligament and the right round ligament .  This gives [place] entry into the retroperitoneal space with an easy dissection of the anterior broad ligament. The ligament was opened all the way to the left round ligament.   We then proceed with systematic dissection of the bladder from the anterior vaginal cuff which is easily identified with the KOH ring. The plane of dissection is confirmed with filling  the bladder with 200 cc of saline. We are able to dissect the bladder 2 cm below the KOH ring. We then opened the posterior right broad ligament all the way to the posterior KOH ring after identifying the full course of the right ureter.   Moving to the left side we Seal and cut  the left round ligament , the left utero-ovarian  ligament and  the mesosalpinx in between. Entry into the retroperitoneal space allows [place] to complete dissection of the bladder on the left side and skeletonized the uterine vessels. The left broad ligament is then dissected all the way to the posterior KOH ring after identifying the full course of the left ureter.   With pressure on the KOH ring and the bladder fully dissected, we cauterize and cut the uterine vessels on both sides at the level of the KOH ring.  The vaginal occluder is inflated and we proceed with a 360 colpotomy using an open monopolar scissors and freeing the uterus entirely.  The uterus is delivered vaginally with both tubes using only traction. The vaginal occluder is reinserted in the vagina to maintain pneumoperitoneum.  Instruments are then modified for a suture cut in arm #4 and a long tip forcep in arm #2. We proceed with closure of the vaginal cuff with 2 running sutures of 0 V-Lock. We irrigated profusely with warm saline and complete hemostasis with bipolar cauterization on the bladder flap.  Hemostasis is adequate. We confirm 2 normal ureters with good mobility and no dilatation.  A sheet of Interceed , divided in 2, is placed on the vaginal cuff and behind the ovaries.  All instruments are then removed and the robot is undocked. Console time: 2 hours and 29 minutes.  All trochars are removed under direct visualization after evacuating the pneumoperitoneum.  The fascia of the supraumbilical incision is closed with the previously placed pursestring suture of 0 Vicryl. All incisions are then closed with subcuticular suture of 3-0 Monocryl and Dermabond.  A speculum is inserted in the vagina to confirm a adequate closure of the vaginal cuff and good hemostasis.  Instrument and sponge count is complete x2. The procedure is well tolerated by the patient is taken to recovery room in a well and stable condition.  Dr Estanislado Pandy was present and scrubbed at all times. Surgical  assistance was required due to the complexity of the anatomy and the robotic approach of the procedure.   Estimated blood loss: 50  Specimen: Uterus and tubes weighing 210 g sent to pathology

## 2023-11-14 NOTE — Anesthesia Postprocedure Evaluation (Signed)
 Anesthesia Post Note  Patient: Donna [name]  Procedure(s) Performed: XI ROBOTIC ASSISTED TOTAL HYSTERECTOMY WITH BILATERAL SALPINGECTOMY (Bilateral)     Patient location during evaluation: PACU Anesthesia Type: General Level of consciousness: awake and alert Pain management: pain level controlled Vital Signs Assessment: post-procedure vital signs reviewed and stable Respiratory status: spontaneous breathing, nonlabored ventilation, respiratory function stable and patient connected to nasal cannula oxygen Cardiovascular status: blood pressure returned to baseline and stable Postop Assessment: no apparent nausea or vomiting Anesthetic complications: no   There were no known notable events for this encounter.  Last Vitals:  Vitals:   11/14/23 1315 11/14/23 1330  BP: (!) 140/86   Pulse: 82 67  Resp: 17 10  Temp:    SpO2: 100% 100%    Last Pain:  Vitals:   11/14/23 1300  TempSrc:   PainSc: 0-No pain                 Collene Schlichter

## 2023-11-14 NOTE — Anesthesia Procedure Notes (Signed)
 Procedure Name: Intubation Date/Time: 11/14/2023 8:25 AM  Performed by: Allyn Kenner, CRNAPre-anesthesia Checklist: Patient identified, Emergency Drugs available, Suction available and Patient being monitored Patient Re-evaluated:Patient Re-evaluated prior to induction Oxygen Delivery Method: Circle System Utilized Preoxygenation: Pre-oxygenation with 100% oxygen Induction Type: IV induction Ventilation: Mask ventilation without difficulty Laryngoscope Size: Mac and 3 Grade View: Grade I Tube type: Oral Tube size: 7.0 mm Number of attempts: 1 Airway Equipment and Method: Stylet and Oral airway Placement Confirmation: ETT inserted through vocal cords under direct vision, positive ETCO2 and breath sounds checked- equal and bilateral Secured at: 21 cm Tube secured with: Tape Dental Injury: Teeth and Oropharynx as per pre-operative assessment

## 2023-11-14 NOTE — Transfer of Care (Signed)
 Immediate Anesthesia Transfer of Care Note  Patient: Donna [name]  Procedure(s) Performed: XI ROBOTIC ASSISTED TOTAL HYSTERECTOMY WITH BILATERAL SALPINGECTOMY (Bilateral)  Patient Location: PACU  Anesthesia Type:General  Level of Consciousness: awake, alert , and oriented  Airway & Oxygen Therapy: Patient Spontanous Breathing and Patient connected to nasal cannula oxygen  Post-op Assessment: Report given to RN and Post -op Vital signs reviewed and stable  Post vital signs:     Last Vitals:  Vitals Value Taken Time  BP 108/60 11/14/23 1239  Temp 37 C 11/14/23 1239  Pulse 74 11/14/23 1240  Resp 11 11/14/23 1240  SpO2 99 % 11/14/23 1240  Vitals shown include unfiled device data.  Last Pain:  Vitals:   11/14/23 0625  TempSrc: Oral  PainSc: 0-No pain      Patients Stated Pain Goal: 6 (11/14/23 2952)  Complications: No notable events documented.

## 2023-11-14 NOTE — Interval H&P Note (Signed)
 History and Physical Interval Note:  11/14/2023 8:03 AM  Donna [name]  has presented today for surgery, with the diagnosis of pelvic pain with hematometria, bilateral hydrosalpinx.  The various methods of treatment have been discussed with the patient and family. After consideration of risks, benefits and other options for treatment, the patient has consented to  Procedure(s) with comments: XI ROBOTIC ASSISTED TOTAL HYSTERECTOMY WITH BILATERAL SALPINGECTOMY (Bilateral) [facility] as a surgical intervention.  The patient's history has been reviewed, patient examined, no change in status, stable for surgery.  I have reviewed the patient's chart and labs.  Questions were answered to the patient's satisfaction.     Dois Davenport A Robbin Loughmiller

## 2023-11-15 ENCOUNTER — Encounter (HOSPITAL_COMMUNITY): Payer: Self-pay | Admitting: Obstetrics and Gynecology

## 2023-11-20 LAB — SURGICAL PATHOLOGY

## 2024-01-13 ENCOUNTER — Emergency Department (HOSPITAL_COMMUNITY)

## 2024-01-13 ENCOUNTER — Encounter (HOSPITAL_COMMUNITY): Payer: Self-pay | Admitting: Emergency Medicine

## 2024-01-13 ENCOUNTER — Emergency Department (HOSPITAL_COMMUNITY)
Admission: EM | Admit: 2024-01-13 | Discharge: 2024-01-14 | Discharge status: Against Medical Advice | Attending: Emergency Medicine | Admitting: Emergency Medicine

## 2024-01-13 ENCOUNTER — Telehealth: Payer: Self-pay | Admitting: Obstetrics and Gynecology

## 2024-01-13 ENCOUNTER — Ambulatory Visit
Admission: RE | Admit: 2024-01-13 | Discharge: 2024-01-13 | Disposition: A | Discharge status: Home / Self Care | Source: Ambulatory Visit | Attending: Obstetrics and Gynecology | Admitting: Obstetrics and Gynecology

## 2024-01-13 ENCOUNTER — Other Ambulatory Visit: Payer: Self-pay

## 2024-01-13 ENCOUNTER — Other Ambulatory Visit: Payer: Self-pay | Admitting: Obstetrics and Gynecology

## 2024-01-13 DIAGNOSIS — R102 Pelvic and perineal pain: Secondary | ICD-10-CM

## 2024-01-13 DIAGNOSIS — Z5321 Procedure and treatment not carried out due to patient leaving prior to being seen by health care provider: Secondary | ICD-10-CM | POA: Insufficient documentation

## 2024-01-13 DIAGNOSIS — G8918 Other acute postprocedural pain: Secondary | ICD-10-CM | POA: Insufficient documentation

## 2024-01-13 DIAGNOSIS — R509 Fever, unspecified: Secondary | ICD-10-CM | POA: Insufficient documentation

## 2024-01-13 LAB — URINALYSIS, W/ REFLEX TO CULTURE (INFECTION SUSPECTED)
Glucose, UA: NEGATIVE mg/dL
Hgb urine dipstick: NEGATIVE
Ketones, ur: 5 mg/dL — AB
Nitrite: NEGATIVE
Protein, ur: 100 mg/dL — AB
Specific Gravity, Urine: >1.046 — ABNORMAL HIGH (ref 1.005–1.030)
pH: 5 (ref 5.0–8.0)

## 2024-01-13 LAB — CBC WITH DIFFERENTIAL/PLATELET
Abs Immature Granulocytes: 0.06 10*3/uL (ref 0.00–0.07)
Basophils Absolute: 0 10*3/uL (ref 0.0–0.1)
Basophils Relative: 0 %
Eosinophils Absolute: 0.3 10*3/uL (ref 0.0–0.5)
Eosinophils Relative: 3 %
HCT: 39.2 % (ref 36.0–46.0)
Hemoglobin: 13.4 g/dL (ref 12.0–15.0)
Immature Granulocytes: 1 %
Lymphocytes Relative: 10 %
Lymphs Abs: 1.1 10*3/uL (ref 0.7–4.0)
MCH: 30.8 pg (ref 26.0–34.0)
MCHC: 34.2 g/dL (ref 30.0–36.0)
MCV: 90.1 fL (ref 80.0–100.0)
Monocytes Absolute: 0.7 10*3/uL (ref 0.1–1.0)
Monocytes Relative: 7 %
Neutro Abs: 8.9 10*3/uL — ABNORMAL HIGH (ref 1.7–7.7)
Neutrophils Relative %: 79 %
Platelets: 286 10*3/uL (ref 150–400)
RBC: 4.35 MIL/uL (ref 3.87–5.11)
RDW: 12.1 % (ref 11.5–15.5)
WBC: 11.1 10*3/uL — ABNORMAL HIGH (ref 4.0–10.5)
nRBC: 0 % (ref 0.0–0.2)

## 2024-01-13 LAB — COMPREHENSIVE METABOLIC PANEL WITH GFR
ALT: 13 U/L (ref 0–44)
AST: 18 U/L (ref 15–41)
Albumin: 3.7 g/dL (ref 3.5–5.0)
Alkaline Phosphatase: 59 U/L (ref 38–126)
Anion gap: 10 (ref 5–15)
BUN: 9 mg/dL (ref 6–20)
CO2: 22 mmol/L (ref 22–32)
Calcium: 8.7 mg/dL — ABNORMAL LOW (ref 8.9–10.3)
Chloride: 106 mmol/L (ref 98–111)
Creatinine, Ser: 1.01 mg/dL — ABNORMAL HIGH (ref 0.44–1.00)
GFR, Estimated: >60 mL/min (ref >60)
Glucose, Bld: 93 mg/dL (ref 70–99)
Potassium: 4 mmol/L (ref 3.5–5.1)
Sodium: 138 mmol/L (ref 135–145)
Total Bilirubin: 0.8 mg/dL (ref 0.0–1.2)
Total Protein: 7 g/dL (ref 6.5–8.1)

## 2024-01-13 LAB — I-STAT CG4 LACTIC ACID, ED: Lactic Acid, Venous: 0.5 mmol/L (ref 0.5–1.9)

## 2024-01-13 MED ORDER — IOPAMIDOL (ISOVUE-300) INJECTION 61%
100.0000 mL | Freq: Once | INTRAVENOUS | Status: AC | PRN
  Administered 2024-01-13: 100 mL via INTRAVENOUS

## 2024-01-13 NOTE — ED Triage Notes (Signed)
 Patient coming to ED for evaluation of abdominal pain and fever s/p partial hysterectomy on 11/14/23.  Reports noticed abdominal pain on Saturday morning.  Was seen by surgeon today for follow up and have CT scan preformed.  Was told she had a hematoma between the vaginal vault and the rectum.  Given Rocephin IM for prevention.  Was told to come to ED if developed a fever.  Stated to have chills after 5 PM and fever was 100.8 PTA.  Took Toradol  PTA.

## 2024-01-13 NOTE — Telephone Encounter (Signed)
 Returned call to patient from GYN answering service. Patient confirmed ID x2.   Partial hysterectomy in March (now 8 weeks postop). Checked internally after intercourse. Had CT scan and it demonstrated hematoma and went to office for ABX injection. When she got home from visit, having chills in grocery store , 99.26F initially and then 100F around 10-15 minutes ago. No vomiting, occasionally nausea. Pain I 5-6/10 and then has sharp pain intermittently and pressure in vagina 8-9. Took toradol  about 5 minutes ago. 1g of rocephin in office. No fever when she was seen in clinic. Temp this morning of 98.31F Given clinda 300 mg, has not started taking this yet. Advised to only have liquids, recheck temperature in about 15 minutes. If fever or other symptoms worsen or present, recommend presentation to ED for further evaluation and IV ABX.   Patient agrees to plan.

## 2024-01-13 NOTE — ED Notes (Signed)
 Pt provided urine sample in lobby. Pt sample at sort desk.

## 2024-01-14 NOTE — ED Notes (Signed)
 Pt left. Vitals completed prior to leaving.

## 2024-01-27 ENCOUNTER — Other Ambulatory Visit: Payer: Self-pay | Admitting: Obstetrics and Gynecology

## 2024-01-27 DIAGNOSIS — N99840 Postprocedural hematoma of a genitourinary system organ or structure following a genitourinary system procedure: Secondary | ICD-10-CM

## 2024-02-04 ENCOUNTER — Ambulatory Visit
Admission: RE | Admit: 2024-02-04 | Discharge: 2024-02-04 | Disposition: A | Discharge status: Home / Self Care | Source: Ambulatory Visit | Attending: Obstetrics and Gynecology | Admitting: Obstetrics and Gynecology

## 2024-02-04 DIAGNOSIS — N99840 Postprocedural hematoma of a genitourinary system organ or structure following a genitourinary system procedure: Secondary | ICD-10-CM

## 2024-02-04 MED ORDER — IOPAMIDOL (ISOVUE-300) INJECTION 61%
100.0000 mL | Freq: Once | INTRAVENOUS | Status: AC | PRN
  Administered 2024-02-04: 100 mL via INTRAVENOUS

## 2024-02-27 ENCOUNTER — Ambulatory Visit
Admission: RE | Admit: 2024-02-27 | Discharge: 2024-02-27 | Disposition: A | Discharge status: Home / Self Care | Source: Ambulatory Visit | Attending: Obstetrics and Gynecology | Admitting: Obstetrics and Gynecology

## 2024-02-27 ENCOUNTER — Ambulatory Visit (HOSPITAL_COMMUNITY): Admitting: Anesthesiology

## 2024-02-27 ENCOUNTER — Encounter (HOSPITAL_COMMUNITY): Payer: Self-pay | Admitting: Obstetrics and Gynecology

## 2024-02-27 ENCOUNTER — Observation Stay (HOSPITAL_COMMUNITY)
Admission: AD | Admit: 2024-02-27 | Discharge: 2024-02-28 | Disposition: A | Discharge status: Home / Self Care | Source: Ambulatory Visit | Attending: Obstetrics and Gynecology | Admitting: Obstetrics and Gynecology

## 2024-02-27 ENCOUNTER — Other Ambulatory Visit: Payer: Self-pay

## 2024-02-27 ENCOUNTER — Other Ambulatory Visit: Payer: Self-pay | Admitting: Obstetrics and Gynecology

## 2024-02-27 ENCOUNTER — Encounter (HOSPITAL_COMMUNITY)
Admission: AD | Disposition: A | Discharge status: Home / Self Care | Payer: Self-pay | Source: Ambulatory Visit | Attending: Obstetrics and Gynecology

## 2024-02-27 DIAGNOSIS — N99840 Postprocedural hematoma of a genitourinary system organ or structure following a genitourinary system procedure: Secondary | ICD-10-CM

## 2024-02-27 DIAGNOSIS — T81328A Disruption or dehiscence of closure of other specified internal operation (surgical) wound, initial encounter: Secondary | ICD-10-CM | POA: Diagnosis present

## 2024-02-27 DIAGNOSIS — N7689 Other specified inflammation of vagina and vulva: Secondary | ICD-10-CM

## 2024-02-27 DIAGNOSIS — K565 Intestinal adhesions [bands], unspecified as to partial versus complete obstruction: Secondary | ICD-10-CM

## 2024-02-27 DIAGNOSIS — Y848 Other medical procedures as the cause of abnormal reaction of the patient, or of later complication, without mention of misadventure at the time of the procedure: Secondary | ICD-10-CM | POA: Diagnosis not present

## 2024-02-27 HISTORY — PX: CYSTOSCOPY: SHX5120

## 2024-02-27 HISTORY — PX: LAPAROSCOPY: SHX197

## 2024-02-27 HISTORY — PX: REPAIR VAGINAL CUFF: SHX6067

## 2024-02-27 LAB — CBC
HCT: 43.8 % (ref 36.0–46.0)
Hemoglobin: 15 g/dL (ref 12.0–15.0)
MCH: 30.3 pg (ref 26.0–34.0)
MCHC: 34.2 g/dL (ref 30.0–36.0)
MCV: 88.5 fL (ref 80.0–100.0)
Platelets: 294 10*3/uL (ref 150–400)
RBC: 4.95 MIL/uL (ref 3.87–5.11)
RDW: 12.8 % (ref 11.5–15.5)
WBC: 13.6 10*3/uL — ABNORMAL HIGH (ref 4.0–10.5)
nRBC: 0 % (ref 0.0–0.2)

## 2024-02-27 LAB — POCT PREGNANCY, URINE: Preg Test, Ur: NEGATIVE

## 2024-02-27 LAB — TYPE AND SCREEN
ABO/RH(D): O POS
Antibody Screen: NEGATIVE

## 2024-02-27 SURGERY — LAPAROSCOPY, DIAGNOSTIC
Anesthesia: General

## 2024-02-27 MED ORDER — SODIUM CHLORIDE 0.9 % IR SOLN
Status: DC | PRN
  Administered 2024-02-27: 1500 mL
  Administered 2024-02-27: 3000 mL
  Administered 2024-02-27: 200 mL

## 2024-02-27 MED ORDER — BUPIVACAINE HCL (PF) 0.25 % IJ SOLN
INTRAMUSCULAR | Status: DC | PRN
  Administered 2024-02-27: 28 mL

## 2024-02-27 MED ORDER — FENTANYL CITRATE (PF) 100 MCG/2ML IJ SOLN
INTRAMUSCULAR | Status: DC | PRN
  Administered 2024-02-27: 100 ug via INTRAVENOUS
  Administered 2024-02-27: 50 ug via INTRAVENOUS
  Administered 2024-02-27 (×2): 100 ug via INTRAVENOUS

## 2024-02-27 MED ORDER — FENTANYL CITRATE (PF) 250 MCG/5ML IJ SOLN
INTRAMUSCULAR | Status: AC
  Filled 2024-02-27: qty 5

## 2024-02-27 MED ORDER — GABAPENTIN 300 MG PO CAPS
300.0000 mg | ORAL_CAPSULE | ORAL | Status: AC
  Administered 2024-02-27: 300 mg via ORAL
  Filled 2024-02-27: qty 1

## 2024-02-27 MED ORDER — BUPIVACAINE HCL (PF) 0.25 % IJ SOLN
INTRAMUSCULAR | Status: AC
  Filled 2024-02-27: qty 30

## 2024-02-27 MED ORDER — SCOPOLAMINE 1 MG/3DAYS TD PT72
1.0000 | MEDICATED_PATCH | TRANSDERMAL | Status: DC
  Administered 2024-02-27: 1.5 mg via TRANSDERMAL
  Filled 2024-02-27: qty 1

## 2024-02-27 MED ORDER — POVIDONE-IODINE 10 % EX SWAB
2.0000 | Freq: Once | CUTANEOUS | Status: AC
  Administered 2024-02-27: 2 via TOPICAL

## 2024-02-27 MED ORDER — PROPOFOL 10 MG/ML IV BOLUS
INTRAVENOUS | Status: DC | PRN
  Administered 2024-02-27: 160 mg via INTRAVENOUS

## 2024-02-27 MED ORDER — FENTANYL CITRATE (PF) 100 MCG/2ML IJ SOLN
25.0000 ug | INTRAMUSCULAR | Status: DC | PRN
  Administered 2024-02-27: 50 ug via INTRAVENOUS

## 2024-02-27 MED ORDER — MIDAZOLAM HCL 5 MG/5ML IJ SOLN
INTRAMUSCULAR | Status: DC | PRN
  Administered 2024-02-27: 2 mg via INTRAVENOUS

## 2024-02-27 MED ORDER — TRAMADOL HCL 50 MG PO TABS: 50.0000 mg | ORAL_TABLET | Freq: Four times a day (QID) | ORAL | Status: DC | PRN

## 2024-02-27 MED ORDER — ROCURONIUM BROMIDE 100 MG/10ML IV SOLN
INTRAVENOUS | Status: DC | PRN
  Administered 2024-02-27: 50 mg via INTRAVENOUS
  Administered 2024-02-27: 20 mg via INTRAVENOUS
  Administered 2024-02-27: 10 mg via INTRAVENOUS

## 2024-02-27 MED ORDER — CEFAZOLIN SODIUM-DEXTROSE 2-3 GM-%(50ML) IV SOLR
INTRAVENOUS | Status: DC | PRN
  Administered 2024-02-27: 2 g via INTRAVENOUS

## 2024-02-27 MED ORDER — MIDAZOLAM HCL 2 MG/2ML IJ SOLN
INTRAMUSCULAR | Status: AC
  Filled 2024-02-27: qty 2

## 2024-02-27 MED ORDER — SUGAMMADEX SODIUM 200 MG/2ML IV SOLN
INTRAVENOUS | Status: DC | PRN
  Administered 2024-02-27: 200 mg via INTRAVENOUS

## 2024-02-27 MED ORDER — PHENYLEPHRINE HCL (PRESSORS) 10 MG/ML IV SOLN
INTRAVENOUS | Status: DC | PRN
  Administered 2024-02-27: 80 ug via INTRAVENOUS

## 2024-02-27 MED ORDER — CHLORHEXIDINE GLUCONATE 0.12 % MT SOLN
15.0000 mL | Freq: Once | OROMUCOSAL | Status: DC
Start: 2024-02-27 — End: 2024-02-28

## 2024-02-27 MED ORDER — VASOPRESSIN 20 UNIT/ML IV SOLN
INTRAVENOUS | Status: AC
Start: 2024-02-27 — End: 2024-02-27
  Filled 2024-02-27: qty 1

## 2024-02-27 MED ORDER — CELECOXIB 200 MG PO CAPS
400.0000 mg | ORAL_CAPSULE | ORAL | Status: AC
  Administered 2024-02-27: 400 mg via ORAL
  Filled 2024-02-27: qty 2

## 2024-02-27 MED ORDER — FENTANYL CITRATE (PF) 100 MCG/2ML IJ SOLN
INTRAMUSCULAR | Status: AC
Start: 2024-02-27 — End: 2024-02-27
  Filled 2024-02-27: qty 2

## 2024-02-27 MED ORDER — CHLORHEXIDINE GLUCONATE 0.12 % MT SOLN
OROMUCOSAL | Status: AC
  Administered 2024-02-27: 15 mL
  Filled 2024-02-27: qty 15

## 2024-02-27 MED ORDER — SUCCINYLCHOLINE CHLORIDE 200 MG/10ML IV SOSY
PREFILLED_SYRINGE | INTRAVENOUS | Status: AC
Start: 2024-02-27 — End: 2024-02-27
  Filled 2024-02-27: qty 10

## 2024-02-27 MED ORDER — DEXAMETHASONE SODIUM PHOSPHATE 10 MG/ML IJ SOLN
INTRAMUSCULAR | Status: DC | PRN
  Administered 2024-02-27: 10 mg via INTRAVENOUS

## 2024-02-27 MED ORDER — IOPAMIDOL (ISOVUE-300) INJECTION 61%
100.0000 mL | Freq: Once | INTRAVENOUS | Status: AC | PRN
  Administered 2024-02-27: 100 mL via INTRAVENOUS

## 2024-02-27 MED ORDER — LACTATED RINGERS IV SOLN: INTRAVENOUS | Status: DC | PRN

## 2024-02-27 MED ORDER — ONDANSETRON HCL 4 MG/2ML IJ SOLN
INTRAMUSCULAR | Status: AC
  Filled 2024-02-27: qty 2

## 2024-02-27 MED ORDER — LIDOCAINE HCL (CARDIAC) PF 100 MG/5ML IV SOSY
PREFILLED_SYRINGE | INTRAVENOUS | Status: DC | PRN
  Administered 2024-02-27: 60 mg via INTRAVENOUS

## 2024-02-27 MED ORDER — ENSURE PRE-SURGERY PO LIQD: 296.0000 mL | Freq: Once | ORAL | Status: DC

## 2024-02-27 MED ORDER — LIDOCAINE 2% (20 MG/ML) 5 ML SYRINGE
INTRAMUSCULAR | Status: AC
  Filled 2024-02-27: qty 5

## 2024-02-27 MED ORDER — ONDANSETRON HCL 4 MG/2ML IJ SOLN
INTRAMUSCULAR | Status: DC | PRN
  Administered 2024-02-27: 4 mg via INTRAVENOUS

## 2024-02-27 MED ORDER — LACTATED RINGERS IV SOLN: INTRAVENOUS | Status: DC

## 2024-02-27 MED ORDER — ACETAMINOPHEN 500 MG PO TABS
1000.0000 mg | ORAL_TABLET | ORAL | Status: AC
  Administered 2024-02-27: 1000 mg via ORAL
  Filled 2024-02-27: qty 2

## 2024-02-27 MED ORDER — ORAL CARE MOUTH RINSE
15.0000 mL | Freq: Once | OROMUCOSAL | Status: DC
Start: 2024-02-27 — End: 2024-02-28

## 2024-02-27 MED ORDER — POVIDONE-IODINE 10 % EX SWAB: 2.0000 | Freq: Once | CUTANEOUS | Status: DC

## 2024-02-27 SURGICAL SUPPLY — 40 items
APPLICATOR COTTON TIP 6 STRL (MISCELLANEOUS) ×2 IMPLANT
APPLICATOR COTTON TIP 6IN STRL (MISCELLANEOUS) ×2 IMPLANT
BARRIER ADHS 3X4 INTERCEED (GAUZE/BANDAGES/DRESSINGS) IMPLANT
COVER BACK TABLE 60X90IN (DRAPES) IMPLANT
COVER MAYO STAND STRL (DRAPES) IMPLANT
DERMABOND ADVANCED .7 DNX12 (GAUZE/BANDAGES/DRESSINGS) ×2 IMPLANT
DRSG OPSITE POSTOP 3X4 (GAUZE/BANDAGES/DRESSINGS) IMPLANT
DURAPREP 26ML APPLICATOR (WOUND CARE) ×2 IMPLANT
GAUZE 4X4 16PLY ~~LOC~~+RFID DBL (SPONGE) IMPLANT
GAUZE PACKING 1 X5 YD ST (GAUZE/BANDAGES/DRESSINGS) ×2 IMPLANT
GLOVE BIO SURGEON STRL SZ 6.5 (GLOVE) IMPLANT
GLOVE BIOGEL PI IND STRL 7.0 (GLOVE) ×8 IMPLANT
GLOVE ECLIPSE 6.5 STRL STRAW (GLOVE) ×2 IMPLANT
GLOVE SURG UNDER POLY LF SZ7 (GLOVE) ×4 IMPLANT
GOWN STRL REUS W/ TWL LRG LVL3 (GOWN DISPOSABLE) ×4 IMPLANT
IRRIGATION SUCT STRKRFLW 2 WTP (MISCELLANEOUS) IMPLANT
KIT PINK PAD W/HEAD ARM REST (MISCELLANEOUS) ×2 IMPLANT
KIT TURNOVER KIT B (KITS) ×2 IMPLANT
NS IRRIG 1000ML POUR BTL (IV SOLUTION) ×2 IMPLANT
OCCLUDER COLPOPNEUMO (BALLOONS) IMPLANT
PACK LAPAROSCOPY BASIN (CUSTOM PROCEDURE TRAY) ×2 IMPLANT
PAD OB MATERNITY 11 LF (PERSONAL CARE ITEMS) ×2 IMPLANT
SCISSORS LAP 5X35 DISP (ENDOMECHANICALS) IMPLANT
SET CYSTO W/LG BORE CLAMP LF (SET/KITS/TRAYS/PACK) IMPLANT
SET TUBE SMOKE EVAC HIGH FLOW (TUBING) ×2 IMPLANT
SHEET MEDIUM DRAPE 40X70 STRL (DRAPES) IMPLANT
SPIKE FLUID TRANSFER (MISCELLANEOUS) IMPLANT
SUT MNCRL AB 3-0 PS2 27 (SUTURE) ×2 IMPLANT
SUT VIC AB 0 CT1 18XCR BRD8 (SUTURE) ×6 IMPLANT
SUT VIC AB 0 CT1 27XBRD ANBCTR (SUTURE) IMPLANT
SUT VICRYL 0 UR6 27IN ABS (SUTURE) ×4 IMPLANT
SYR 10ML LL (SYRINGE) IMPLANT
TOWEL GREEN STERILE FF (TOWEL DISPOSABLE) ×4 IMPLANT
TRAY FOLEY W/BAG SLVR 14FR (SET/KITS/TRAYS/PACK) ×2 IMPLANT
TROCAR BALLN 12MMX100 BLUNT (TROCAR) ×2 IMPLANT
TROCAR XCEL NON-BLD 5MMX100MML (ENDOMECHANICALS) ×2 IMPLANT
TROCAR Z-THREAD OPTICAL 5X100M (TROCAR) IMPLANT
TUBE CONNECTING 12X1/4 (SUCTIONS) IMPLANT
UNDERPAD 30X36 HEAVY ABSORB (UNDERPADS AND DIAPERS) ×2 IMPLANT
YANKAUER SUCT BULB TIP NO VENT (SUCTIONS) IMPLANT

## 2024-02-27 NOTE — Interval H&P Note (Signed)
 History and Physical Interval Note:  02/27/2024 7:46 PM  Donna [name]  has presented today for surgery, with the diagnosis of vaginal cuff leak.  The various methods of treatment have been discussed with the patient and family. After consideration of risks, benefits and other options for treatment, the patient has consented to  Procedure(s) with comments: LAPAROSCOPY, DIAGNOSTIC (N/A) - POSSIBLE LYSIS OF DEHISCENCE REPAIR, VAGINAL CUFF (N/A) as a surgical intervention.  The patient's history has been reviewed, patient examined, no change in status, stable for surgery.  I have reviewed the patient's chart and labs.  Questions were answered to the patient's satisfaction.     [name] A Axle Parfait

## 2024-02-27 NOTE — Anesthesia Procedure Notes (Signed)
 Procedure Name: Intubation Date/Time: 02/27/2024 8:01 PM  Performed by: Dale Strausser T, CRNAPre-anesthesia Checklist: Patient identified, Emergency Drugs available, Suction available and Patient being monitored Patient Re-evaluated:Patient Re-evaluated prior to induction Oxygen Delivery Method: Circle system utilized Preoxygenation: Pre-oxygenation with 100% oxygen Induction Type: IV induction, Rapid sequence and Cricoid Pressure applied Ventilation: Mask ventilation without difficulty Laryngoscope Size: Mac and 3 Grade View: Grade I Tube type: Oral Tube size: 7.0 mm Number of attempts: 1 Airway Equipment and Method: Stylet and Oral airway Placement Confirmation: ETT inserted through vocal cords under direct vision, positive ETCO2 and breath sounds checked- equal and bilateral Secured at: 21 cm Tube secured with: Tape Dental Injury: Teeth and Oropharynx as per pre-operative assessment

## 2024-02-27 NOTE — Op Note (Signed)
 Preoperative diagnosis: vaginal cuff dehiscence  Postoperative diagnosis: Same  Anesthesia: Gen.  Anesthesiologist: Dr. Raechel Bulla  Procedure: Diagnostic laparoscopy, lysis of adhesions, peritoneal lavage, vaginal cuff closure, cystoscopy  Surgeon: Dr. [name] Zaxton Angerer  Assistant: Ivin Marrow PA-C  Estimated blood loss: 25 cc  Procedure:  After being informed of the planned procedure with possible complications including bleeding, infection, injury to other organs,informed consent is obtained and the patient is taken to OR # 7. She is placed in lithotomy position, prepped and draped in a sterile fashion, and a foley catheter is placed.   A vaginal occluder is inserted in the vagina.  We infiltrate the umbilical area using 10 cc of Marcaine  0.25 and perform a semi-elliptical incision which is brought down bluntly to the fascia. The fascia is identified and grasped with Coker forceps. The fascia is opened with Mayo scissors. Peritoneum is entered bluntly. A pursestring suture of 0 Vicryl is placed on the fascia. A 10 mm Hassan trocar is easily inserted in the abdominal cavity and is held in place both by the intrauterine balloon and the previously placed pursestring suture. This allows for easy insufflation of the pneumoperitoneum using warm CO2 at a maximum pressure of 15 mmHg.  We placed two 5 mm trocars under direct visualization in the right and the left lower quadrant after infiltrating with Marcaine  0.25.  Observation: We observe a small amount of purulent fluid in the pelvis as well as friable adhesions between the omentum and the vaginal cuff.  There are adhesions between the omentum and the left pelvic wall.  Ovaries are normal.  Liver is normal.  Gallbladder is normal.  Bowel loops are distended throughout the abdominal cavity.  We suctioned the fluid in the pelvic cavity and collect some to send for culture.  We irrigated profusely.  The patient is placed in 25 degree Trendelenburg  and using atraumatic instrument bowel loops are pushed into the abdominal cavity in order to free the pelvis.  A friable adhesion between the omentum and the vaginal cuff is sharply dissected.  This allows for better visualization of the vaginal cuff.  We confirmed complete dehiscence of the cuff with moderate edema.  We also see pseudomembranes below the posterior aspect of the vaginal cuff.   Once we are comfortable with proper retraction of bowel loops, keeping the laparoscope in the abdominal cavity to continuously keep the vaginal cuff under vision, we proceeded with vaginal approach.  We closed the dehiscence of the vaginal cuff using multiple figure-of-eight stitches of 0 Vicryl taking care to have a full-thickness closure.  After satisfactory closure we returned to laparoscopic approach.  We then irrigated profusely the abdomen and the pelvis until clear return.  We confirmed satisfactory hemostasis.  All instruments are then removed after evacuating the pneumoperitoneum. The fascia of the umbilical incision is closed with the purse string suture of 0 Vicryl. The skin is closed with a subcuticular suture of 3-0 Monocryl and Dermabond.  Proceed with postoperative cystoscopy which reveals an intact bladder as well as 2 normal ureteral jets.  Rectal exam is also normal.  Instrument and sponge count is complete x2. Estimated blood loss is minimal.The procedure is well tolerated by the patient is taken to recovery room in a well and stable condition.    Specimen: Pelvic fluid sent for cultures  [facility] A  MD

## 2024-02-27 NOTE — Transfer of Care (Signed)
 Immediate Anesthesia Transfer of Care Note  Patient: Donna [name]  Procedure(s) Performed: LAPAROSCOPY, DIAGNOSTIC REPAIR, VAGINAL CUFF CYSTOSCOPY  Patient Location: PACU  Anesthesia Type:General  Level of Consciousness: drowsy  Airway & Oxygen Therapy: Patient Spontanous Breathing and Patient connected to nasal cannula oxygen  Post-op Assessment: Report given to RN, Post -op Vital signs reviewed and stable, and Patient moving all extremities  Post vital signs: Reviewed and stable  Last Vitals:  Vitals Value Taken Time  BP 118/77 02/27/24 22:37  Temp    Pulse    Resp 12 02/27/24 22:41  SpO2    Vitals shown include unfiled device data.  Last Pain:  Vitals:   02/27/24 1654  TempSrc: Oral  PainSc: 4          Complications: No notable events documented.

## 2024-02-27 NOTE — Anesthesia Preprocedure Evaluation (Addendum)
 Anesthesia Evaluation  Patient identified by MRN, date of birth, ID band Patient awake    Reviewed: Allergy & Precautions, NPO status , Patient's Chart, lab work & pertinent test results  Airway Mallampati: II  TM Distance: >3 FB Neck ROM: Full    Dental no notable dental hx.    Pulmonary neg pulmonary ROS, former smoker   Pulmonary exam normal        Cardiovascular negative cardio ROS  Rhythm:Regular Rate:Normal     Neuro/Psych negative neurological ROS  negative psych ROS   GI/Hepatic negative GI ROS, Neg liver ROS,,,  Endo/Other  negative endocrine ROS    Renal/GU negative Renal ROS  Female GU complaint (vaginal cuff leak)     Musculoskeletal negative musculoskeletal ROS (+)    Abdominal Normal abdominal exam  (+)   Peds  Hematology Lab Results      Component                Value               Date                      WBC                      13.6 (H)            02/27/2024                HGB                      15.0                02/27/2024                HCT                      43.8                02/27/2024                MCV                      88.5                02/27/2024                PLT                      294                 02/27/2024              Anesthesia Other Findings   Reproductive/Obstetrics                             Anesthesia Physical Anesthesia Plan  ASA: 2  Anesthesia Plan: General   Post-op Pain Management: Tylenol  PO (pre-op)*, Celebrex  PO (pre-op)* and Gabapentin  PO (pre-op)*   Induction: Intravenous  PONV Risk Score and Plan: 3 and Ondansetron , Dexamethasone , Midazolam  and Treatment may vary due to age or medical condition  Airway Management Planned: Mask and Oral ETT  Additional Equipment: None  Intra-op Plan:   Post-operative Plan: Extubation in OR  Informed Consent: I have reviewed the patients History and Physical, chart, labs and  discussed the procedure including the risks, benefits and  alternatives for the proposed anesthesia with the patient or authorized representative who has indicated his/her understanding and acceptance.     Dental advisory given  Plan Discussed with: CRNA  Anesthesia Plan Comments:        Anesthesia Quick Evaluation

## 2024-02-27 NOTE — H&P (Signed)
 Donna [name] is a 38 y.o. female, P: 3-0-0-3 presents for closure of vaginal cuff dehiscence.   S/P robotic hysterectomy with BS. Here for recurrent abdominal pain.  Seen 12/26/23 for 6 week post op with normal exam and all restrictions lifted. Seen 01/13/24: Generalized abdominal pain 1. acute onset with sexual intercourse 01/10/24 - 8 weeks post-op of uncomplicated robotic hysterectomy with normal vaginal cuff at 6 week post-op visit 2. Normal bowel sounds/passing gas 3. acute tenderness and rebound: ordering stat CT-scan  01/13/24 CT-scan:  Normal lungs and abdomen. No bowel obstruction or wall thickening. Normal appendix. Pelvis: no free fluid or free air. Posterior to vaginal cuff: 3.9 x 2.3 cm possible fluid collection or less likely fluid filled loop of bowel. Impression: Postoperative changes c/w partial hysterectomy. Small fluid collection posterior to the vaginal cuff and anterior to the rectum. recommend correlation with physical exam and consider further evaluation with repeat CT with oral contrast  02/04/2024 repeat Ct-Scan : normal with resolution of pelvic fluid accumulation  Presents for abdominal pain during intercourse last evening with pain lasting for hours/vomited once and blood on tissue with wiping. Rates pain 6/10 at visit today - took Toradol  this morning with little improvement. Here with partner, Donna [name]. jr/cma  Vaginal bleeding was light and only when wiped last night after intercourse. Feels very bloated and has been burping +++. Normal BM yesterday before this episode. No flatus or BM since. Barely able to move  Prior to having sex last night: denies vaginal discharge or bleeding, denies abdominal pain, reports daily normal BM  Ct-Scan today: Findings suggestive of vaginal cuff dehiscence with developing 5 x 3.4 cm organizing gas and fluid collection within the hysterectomy surgical bed. Associated trace to small volume pneumoperitoneum and free fluid. Past  Medical History  OB History: [id number] ; C-section-2010; SVB-2014 and C-section-2017  GYN History: menarche: 38 YO;   Last PAP smear: 2022 normal  Medical History: Shoulder Fracture  Surgical History: 2025: Robot Assisted Laparoscopic Hysterectomy/Bilateral Salpingectomy; 2024: Endometrial Ablation; 2017 C-section/Tubal Sterilization;  2010-C-section and 2002 Breast Lumpectomy Denies problems with anesthesia or history of blood transfusions  Family History: Breast Cancer, Hypertension, Dementia, Seizure, Eczema, Oral Cancer, Asthma and TB  Social History:  Separated and Employed as a Pensions consultant; Former Smoker and Occasional Alcohol   Medications: Gallifrey 5 mg daily Ketorolac  10 mg every 6 hours as needed Tramadol  50 mg every 6 hours as needed for breakthrough pain  Allergies  Allergen Reactions   Ciprofloxacin Itching and Swelling   Hydrocodone Hives, Itching and Swelling    ROS: Admits to glasses/contact lenses;  Denies headache, vision changes, nasal congestion, dysphagia, tinnitus, dizziness, hoarseness, cough,  chest pain, shortness of breath, nausea, vomiting, diarrhea,constipation,  urinary frequency, urgency  dysuria, hematuria, vaginitis symptoms, pelvic pain, swelling of joints,easy bruising,  myalgias, arthralgias, skin rashes, unexplained weight loss and except as is mentioned in the history of present illness, patient's review of systems is otherwise negative.    Physical Exam  Bp: 116/83;  Weight: 156.4 lbs; Height: 5'5;  BMI: 26  Neck: supple without masses or thyromegaly Lungs: clear to auscultation Heart: regular rate and rhythm Abdomen: distended, soft  with rebound tenderness, diminished bowel sounds (or absent) Pelvic: EGUS-wnl, uterus/cervix-surgically absent, vagina with no tenderness, erythema, abnormal discharge, vesicles; a 4 cm dark area at cuff with no active bleeding; blood tinged liquid discharge suspicious for peritoneal fluid-no  bowel visualized. Extremities:  no clubbing, cyanosis or edema   Assesment: Vaginal Cuff  Dehiscence                      S/P Robotic Hysterectomy   Disposition:  A discussion was held with patient regarding the indication for her procedure(s) along with the risks, which include but are not limited to: reaction to anesthesia, damage to adjacent organs, infection and excessive bleeding. The patient has consented to proceed with Closure of Vaginal Cuff at [facility] on February 27, 2024.   CSN# [id number]   Donna [name] J. [place], PA-C  for Dr. Ulysess Gang. Rivard

## 2024-02-28 ENCOUNTER — Encounter (HOSPITAL_COMMUNITY): Payer: Self-pay | Admitting: Obstetrics and Gynecology

## 2024-02-28 ENCOUNTER — Other Ambulatory Visit: Payer: Self-pay

## 2024-02-28 DIAGNOSIS — T81328A Disruption or dehiscence of closure of other specified internal operation (surgical) wound, initial encounter: Secondary | ICD-10-CM | POA: Diagnosis not present

## 2024-02-28 LAB — CBC
HCT: 38.1 % (ref 36.0–46.0)
Hemoglobin: 12.5 g/dL (ref 12.0–15.0)
MCH: 29.6 pg (ref 26.0–34.0)
MCHC: 32.8 g/dL (ref 30.0–36.0)
MCV: 90.3 fL (ref 80.0–100.0)
Platelets: 250 10*3/uL (ref 150–400)
RBC: 4.22 MIL/uL (ref 3.87–5.11)
RDW: 13.1 % (ref 11.5–15.5)
WBC: 12.2 10*3/uL — ABNORMAL HIGH (ref 4.0–10.5)
nRBC: 0 % (ref 0.0–0.2)

## 2024-02-28 MED ORDER — MENTHOL 3 MG MT LOZG: 1.0000 | LOZENGE | OROMUCOSAL | Status: DC | PRN

## 2024-02-28 MED ORDER — ONDANSETRON HCL 4 MG PO TABS: 4.0000 mg | ORAL_TABLET | Freq: Four times a day (QID) | ORAL | Status: DC | PRN

## 2024-02-28 MED ORDER — DEXTROSE IN LACTATED RINGERS 5 % IV SOLN: INTRAVENOUS | Status: DC

## 2024-02-28 MED ORDER — TRAMADOL HCL 50 MG PO TABS: ORAL_TABLET | ORAL | 0 refills | Status: AC

## 2024-02-28 MED ORDER — SIMETHICONE 80 MG PO CHEW: 80.0000 mg | CHEWABLE_TABLET | Freq: Four times a day (QID) | ORAL | Status: DC | PRN

## 2024-02-28 MED ORDER — IBUPROFEN 600 MG PO TABS: ORAL_TABLET | ORAL | 0 refills | Status: DC

## 2024-02-28 MED ORDER — TRAMADOL HCL 50 MG PO TABS: 50.0000 mg | ORAL_TABLET | Freq: Four times a day (QID) | ORAL | Status: DC | PRN

## 2024-02-28 MED ORDER — ONDANSETRON HCL 4 MG/2ML IJ SOLN: 4.0000 mg | Freq: Four times a day (QID) | INTRAMUSCULAR | Status: DC | PRN

## 2024-02-28 MED ORDER — SIMETHICONE 80 MG PO CHEW
80.0000 mg | CHEWABLE_TABLET | Freq: Four times a day (QID) | ORAL | Status: DC
  Administered 2024-02-28 (×3): 80 mg via ORAL
  Filled 2024-02-28 (×3): qty 1

## 2024-02-28 MED ORDER — KETOROLAC TROMETHAMINE 30 MG/ML IJ SOLN
30.0000 mg | Freq: Four times a day (QID) | INTRAMUSCULAR | Status: DC
  Administered 2024-02-28 (×2): 30 mg via INTRAVENOUS
  Filled 2024-02-28 (×2): qty 1

## 2024-02-28 MED ORDER — ACETAMINOPHEN 500 MG PO TABS: ORAL_TABLET | ORAL | 0 refills | Status: AC

## 2024-02-28 MED ORDER — CEFAZOLIN SODIUM-DEXTROSE 2-4 GM/100ML-% IV SOLN
2.0000 g | Freq: Three times a day (TID) | INTRAVENOUS | Status: AC
  Administered 2024-02-28 (×2): 2 g via INTRAVENOUS
  Filled 2024-02-28 (×2): qty 100

## 2024-02-28 NOTE — Progress Notes (Signed)
 Donna [name] is a37 y.o.  [id number]  Post Op Date # 1: Dx Laparoscopy/LOA/Closure of Vaginal Dehiscence/Cystoscopy  Subjective: Patient is Doing well postoperatively. Patient has good pain control with NSAIDs and Acetaminophen . Has been able to ambulate without difficulty, has voided, tolerated crackers and ginger ale  but has not passed flatus.   Objective: Vital signs in last 24 hours: Temp:  [97.8 F (36.6 C)-98.4 F (36.9 C)] 98.4 F (36.9 C) (06/20 0257) Pulse Rate:  [66-106] 67 (06/20 0257) Resp:  [11-24] 16 (06/20 0257) BP: (101-132)/(69-85) 101/75 (06/20 0257) SpO2:  [97 %-100 %] 99 % (06/20 0257) Weight:  [72.6 kg] 72.6 kg (06/19 1701)  Intake/Output from previous day: 06/19 0701 - 06/20 0700 In: 2000 [I.V.:2000] Out: 75 [Urine:50] Intake/Output this shift: No intake/output data recorded. Recent Labs  Lab 02/27/24 1717  WBC 13.6*  HGB 15.0  HCT 43.8  PLT 294    No results for input(s): NA, K, CL, CO2, BUN, CREATININE, CALCIUM, PROT, BILITOT, ALKPHOS, ALT, AST, GLUCOSE in the last 168 hours.  Invalid input(s): LABALBU  EXAM: General: alert, cooperative, no distress, and sitting up in bed Resp: clear to auscultation bilaterally Cardio: regular rate and rhythm, S1, S2 normal, no murmur, click, rub or gallop GI: decreased bowel sounds in all 4 quadrants, distended, soft with mild tenderness without guarding or rebound. Extremities: Homans sign is negative, no sign of DVT and no calf tenderness Vaginal Bleeding: none and clean perineal pad   Assessment: s/p Procedure(s): LAPAROSCOPY, DIAGNOSTIC REPAIR, VAGINAL CUFF CYSTOSCOPY: stable, progressing well, and slow return of bowel function  Plan: Advance diet Encourage ambulation Simethicone  as directed Will consider discharge later today once bowel function has been optimized  LOS: 0 days    Ivin Marrow, PA-C 02/28/2024 7:20 AM

## 2024-02-28 NOTE — Discharge Instructions (Signed)
 Call [facility] at [phone] if:   You have a temperature greater than or equal to 100.4 degrees Farenheit orally You have pain that is not made better by the pain medication given and taken as directed You have excessive bleeding or problems urinating  Take Colace (Docusate Sodium /Stool Softener) 100 mg 2-3 times daily while taking narcotic pain medicine to avoid constipation or until bowel movements are regular.  Take with food, Ibuprofen  600 mg and Acetaminophen  1000 mg [500 mg x 2] every 6 hours for 5 days then as needed for pain.   You may drive after 2 weeks You may walk up steps  You may shower tomorrow You may resume a regular diet  Keep incisions clean and dry Do not lift over 15 pounds for 6 weeks Avoid anything in vagina until advised by your Gynecologist.

## 2024-02-28 NOTE — Plan of Care (Signed)
  Problem: Education: Goal: Knowledge of General Education information will improve Description: Including pain rating scale, medication(s)/side effects and non-pharmacologic comfort measures Outcome: Progressing   Problem: Pain Managment: Goal: General experience of comfort will improve and/or be controlled Outcome: Progressing   Problem: Safety: Goal: Ability to remain free from injury will improve Outcome: Progressing   Problem: Education: Goal: Understanding of sexual limitations or changes related to disease process or condition will improve Outcome: Progressing   Problem: Self-Concept: Goal: Communication of feelings regarding changes in body function or appearance will improve Outcome: Progressing

## 2024-02-28 NOTE — Anesthesia Postprocedure Evaluation (Signed)
 Anesthesia Post Note  Patient: Donna [name]  Procedure(s) Performed: LAPAROSCOPY, DIAGNOSTIC REPAIR, VAGINAL CUFF CYSTOSCOPY     Patient location during evaluation: PACU Anesthesia Type: General Level of consciousness: awake and alert Pain management: pain level controlled Vital Signs Assessment: post-procedure vital signs reviewed and stable Respiratory status: spontaneous breathing, nonlabored ventilation, respiratory function stable and patient connected to nasal cannula oxygen Cardiovascular status: blood pressure returned to baseline and stable Postop Assessment: no apparent nausea or vomiting Anesthetic complications: no   No notable events documented.  Last Vitals:  Vitals:   02/28/24 0245 02/28/24 0257  BP: 108/76 101/75  Pulse: 87 67  Resp: 15 16  Temp: 36.7 C 36.9 C  SpO2: 100% 99%    Last Pain:  Vitals:   02/28/24 0632  TempSrc:   PainSc: 0-No pain                 Valente Gaskin Marry Kusch

## 2024-02-28 NOTE — Discharge Summary (Signed)
 Physician Discharge Summary  Patient ID: KIANNAH GRUNOW MRN: [id number] DOB/AGE: 1986-03-23 37 y.o.  Admit date: 02/27/2024 Discharge date: 02/28/2024  Admission Diagnoses: Vaginal cuff dehiscence, initial encounter [T81.328A] Vaginal cuff dehiscence [T81.328A]  Discharge Diagnoses: Vaginal cuff dehiscence, initial encounter [T81.328A] Vaginal cuff dehiscence [T81.328A]        Principal Problem:   Vaginal cuff dehiscence, initial encounter Active Problems:   Vaginal cuff dehiscence   Discharged Condition: good.   Hospital Course: Progressing well. Denies pain. Reports only soreness. Ambulating, voiding and tolerating normal diet. Passed flatus x1  Physical Exam:   BP 109/74 (BP Location: Right Arm)   Pulse 72   Temp 98.1 F (36.7 C) (Oral)   Resp 15   Ht 5' 5 (1.651 m)   Wt 72.6 kg   SpO2 100%   BMI 26.63 kg/m  Constitutional: alert, in no distress Cardiovascular: lungs clear, heart with normal rate and rhythm Abdomen: Incision (s) dry and clean, appropriately tender, distended but soft with bowel sounds x 4 Extremities: no significant edema and no evidence of DVT  Labs: Hgb 12 WBC 12.2 (previously 13.6) No organism/growth on preliminary pelvic fluid culture   Consults: None   Disposition: Discharge home. Instructions reviewed.    Allergies as of 02/28/2024       Reactions   Ciprofloxacin Itching, Swelling   Hydrocodone Hives, Itching, Swelling        Medication List     STOP taking these medications    ketorolac  10 MG tablet Commonly known as: TORADOL        TAKE these medications    acetaminophen  500 MG tablet Commonly known as: TYLENOL  Take 2 tablets (1000 mg) every 6 hours for 5 days then as needed for post operative pain What changed:  how much to take how to take this when to take this reasons to take this additional instructions   ibuprofen  600 MG tablet Commonly known as: ADVIL  take 1 tablet po pc every 6 hours for  5 days then  as needed for post operative pain What changed:  how much to take how to take this when to take this reasons to take this additional instructions   traMADol  50 MG tablet Commonly known as: Ultram  take 1 tablet po every 6 hours as needed for breakthrough post operative pain       Follow-up with Dr Duke Gibbons: 03/02/24 at 10:00 am   Signed: Mckinley Spells, MD 02/28/2024, 2:02 PM

## 2024-03-03 LAB — AEROBIC/ANAEROBIC CULTURE W GRAM STAIN (SURGICAL/DEEP WOUND): Culture: NO GROWTH

## 2024-04-23 NOTE — Progress Notes (Signed)
 Subjective: Patient ID:  Donna [name] is a 38 y.o. female  Chief Complaint  Patient presents with  . Vaginal Discharge    Pt c/o vaginal discharge, possible yeast infection. Symptoms started 1-2 days ago.     The following information was reviewed by members of the visit team:  Tobacco  Allergies  Meds  Problems  Med Hx  Surg Hx  OB Status   Fam Hx    38 year old female presents for evaluation of symptoms ongoing since yesterday.  She complains of intense vaginal itching and burning. She denies any recent sexual activity due to partial hysterectomy previously then repair of the vaginal cuff due to dehiscence.  After the repair she did develop bacterial vaginosis and has been treated for that did seem to clear up.   She denies any other symptoms at this time and does not appear to be in distress during this visit.  Vaginal Discharge The patient's primary symptoms include vaginal discharge. Pertinent negatives include no abdominal pain, chills, diarrhea, dysuria, fever, headaches, nausea or vomiting.     Review of Systems  Constitutional:  Negative for chills and fever.  Respiratory:  Negative for shortness of breath.   Cardiovascular:  Negative for chest pain.  Gastrointestinal:  Negative for abdominal pain, diarrhea, nausea and vomiting.  Genitourinary:  Positive for vaginal discharge. Negative for dysuria, genital sores and vaginal pain.  Neurological:  Negative for dizziness and headaches.      Objective  Vitals:   04/23/24 0923  BP: 135/86  Pulse: 91  Resp: 18  Temp: 97.4 F (36.3 C)  TempSrc: Tympanic  SpO2: 100%  Weight: 72.6 kg (160 lb)    No LMP recorded. Patient has had a hysterectomy.   Behavioral Health Screening  Patient Health Questionnaire-2 Score: 0 (04/23/2024  9:21 AM)      Patient's Depression screening is Negative   Depression Plan: Normal/Negative Screening  Physical Exam Vitals and nursing note reviewed.  Constitutional:       Appearance: Normal appearance. She is normal weight.   Cardiovascular:     Rate and Rhythm: Normal rate and regular rhythm.     Heart sounds: Normal heart sounds.  Pulmonary:     Effort: Pulmonary effort is normal.     Breath sounds: Normal breath sounds.   Skin:    General: Skin is warm and dry.     Capillary Refill: Capillary refill takes 2 to 3 seconds.   Neurological:     General: No focal deficit present.     Mental Status: She is alert and oriented to person, place, and time.   Psychiatric:        Mood and Affect: Mood normal.        Behavior: Behavior normal.      Recent Results (from the past 24 hours)  POC HCG Qualitative, Urine   Collection Time: 04/23/24  9:39 AM  Result Value Ref Range   HCG, Urine, POC Negative Negative   Internal Control Acceptable    Kit/Device Lot # [id number]    Kit/Device Expiration Date 05/10/2025   POC Urinalysis Auto without Microscopic   Collection Time: 04/23/24  9:40 AM  Result Value Ref Range   Color, Urine Yellow Yellow   Clarity, Urine Clear Clear   Glucose, Urine Negative Negative mg/dL   Bilirubin, Urine Negative Negative   Ketones, Urine Negative Negative mg/dL   Specific Gravity, Urine 1.020 1.010, 1.015, 1.020, 1.025   Blood, Urine Trace-intact (A) Negative  pH, Urine 7.5 5.0, 5.5, 6.0, 6.5, 7.0, 7.5, 8.0   Protein, Urine Trace (A) Negative mg/dL   Urobilinogen, Urine 0.2 <2.0 mg/dL   Nitrite, Urine Negative Negative   Leukocyte Esterase, Urine Large (A) Negative   Kit/Device Lot # [id number]    Kit/Device Expiration Date 03/09/2025      Radiologist interpretation:   No orders to display     Assessment/Plan   Donna [name] was seen today for vaginal discharge.  Diagnoses and all orders for this visit:  Vaginal discharge -     POC Urinalysis Auto without Microscopic -     POC HCG Qualitative, Urine -     Bacterial Vaginosis (BV), Candida, and Trichomonas via NAA (Swab) -     Chlamydia / Gonococcus (GC), NAAT -      fluconazole (DIFLUCAN) 150 mg tablet; Take 1 pill at the first sign of a yeast infection, may repeat in 72 hours if needed  Leukocytes in urine -     Urine Culture    Patient has been instructed on RX/ OTC medications, dosages, side effects, and possible interactions as associated with each diagnosis in my impression and plan above.  2.   Patient education (verbal/handout) given on diagnosis, pathophysiology, treatment of diagnosis, side effects of medication use for treatment, restrictions while taking medication.  Supportive       Care measures as directed on AVS.  Red Flags associated with diagnosis/es were reviewed and patient instructed on action plan if red flags develop.  3.   Urgent Care Disposition:  Follow up with PCP       They have been instructed that if symptoms worse that should go to Urgent Care, go to the nearest ED, or activate EMS.  4.   Patient agreed with plan and voiced understanding.  NO barriers to adherence perceived by myself.  Electronically signed: Rosaline Jama Collet FNP  Thu 04/23/2024 9:45 AM

## 2024-04-25 NOTE — ED Notes (Addendum)
 CONSULTED GYN Hildegard Maroon.  RESPONDED@[id number] 

## 2024-04-25 NOTE — H&P (Signed)
-------------------------------------------------------------------------------   Attestation signed by Delon DELENA Jackson, MD at 04/25/24 1925 OB/GYN ATTENDING ATTESTATION  Donna [name] was seen and evaluated, including the key elements of service. I discussed the findings, assessment and plan as documented below with Dr. Floyce, and agree with the proposed management and treatment plans. My countersignature will demonstrate my concurrence with that documentation. Pt denied smoking or any medical problems. Ancef  and flagyl given in OR.  Delon DELENA Jackson, MD 04/25/2024 7:24 PM  -------------------------------------------------------------------------------  GYN Consult Name: Donna [name] MRN: [id number] Date of Service: 04/25/2024  CC: Prolapse following intercourse, concern for vaginal cuff dehiscence   HPI: Donna [name] is a 38 y.o. No obstetric history on file. with a hx of hysterectomy who we are consulted on for concern for prolapse. Patient reports that she had partial hysterectomy in March 2025 with revision of vaginal cuff due to dehiscence in June. Patient reports that she was having intercourse this afternoon when she began to have pain and bleeding. Felt like something was falling out. Has also noticed watery vaginal dishcarge during this time.   ROS: All other systems reviewed and negative  GYN Hx: - LMP: No LMP recorded. Patient has had a hysterectomy.  - GYN procedures: Hysterectomy 11/2023 with vaginal cuff revision 02/2024  OB Hx:  OB History  No obstetric history on file.    PMH:  No past medical history on file.  PSH:  No past surgical history on file.  Meds:  Prior to Admission medications  Not on File    Allergies:  Allergies[1]   No family history on file.  SH: Social History[2]  Physical Exam: Vitals:   04/25/24 1600  BP: 129/90  Pulse: 78  Resp:   Temp:   SpO2: 100%   Body mass index is 26.63 kg/m.  General: Alert, NAD HEENT:  Mucus membranes moist.  CV: RRR Resp: CTAB, no increased work of breathing Abd: BS present, soft, nontender to palpation, no rebound or guarding Pelvic: (chaperone- Delon Jackson MD present) Prolapsed organ protruding from introitus that appears to be bowel. Palpation of vaginal cuff with dehiscence noted.  Skin: No rashes.  Ext: No peripheral edema. Psych: Appropriate affect.   Labs: CBC, type and screen stat ordered    Assessment/Plan: Donna [name] is a 38 y.o. No obstetric history on file. With concern for vaginal cuff dehiscence. On exam, prolapsed bowel protruding from vaginal introitus with dehisence of vaginal cuff. Will class 15 min for exploratory laparotomy. Discussed and consented the patient. Risks such as infection, bleeding, organ damage, anesthesia complications, and need for possible blood products were discussed; patient will accept products in case of an emergency. We discussed the risks of a blood transfusion, such as a 1 in 1.2-1.4 million chance of contracting HIV, Hep C.  Examined with Dr. Jackson   Electronically signed: Hildegard FORBES Floyce, MD, MD, 04/25/2024 5:02 PM            [1] Not on File [2]

## 2024-04-25 NOTE — ED Notes (Signed)
 The pt was taken class 15 to the OR and care was handed off to the OR team.

## 2024-04-25 NOTE — Anesthesia Preprocedure Evaluation (Addendum)
  Anesthesia ROS/Med History   Anesthesia History Patient Has Had Previous Anesthesia (-) Anesthesia Complications Pulmonary  (-) Smoking History Dental  Patient Has: No Notable Dental Hx          Physical Exam  Airway Mallampati: II     Cardiovascular  Rhythm: regular Rate: normal   Dental - normal exam  Pulmonary Breath sounds clear to auscultation   Abdominal   Neuro/Psych         Anesthesia Plan (Class 15 minutes. No chart consent obtained.)ASA 2 - emergent   emergency and general   Plan Factors: The patient is not a current smoker.      PAT Visit needed?   intravenous induction Anesthesia Special needs:  ETT and RSI                     BP: 129/90 (04/25/24 1600) Heart Rate: 78 (04/25/24 1600) Resp: 20 (04/25/24 1535) Temp: 98.6 F (37 C) (04/25/24 1535) SpO2: 100 % (04/25/24 1600) Weight: 72.6 kg (160 lb) (04/25/24 1535) BMI (Calculated): 26.6 (04/25/24 1535)

## 2024-04-25 NOTE — ED Provider Notes (Signed)
 [facility]  ED Provider Note  Donna [name] 38 y.o. female DOB: [birth year] MRN: [id number] History   Chief Complaint  Patient presents with  . Vaginal Prolapse    Pt thinks that her vagina fell out. History of hysterectomy this year.     Vaginal Prolapse   History of Present Illness This is a 37yF patient with a history of partial hysterectomy presenting with symptoms of vaginal prolapse. She is accompanied by her mother via phone.  The patient first noticed the issue around 1:30 PM today while using the bathroom shortly after intercourse. She experienced an unusual sensation when wiping, which was followed by abdominal cramping. She also reported mild bleeding after intercourse but does not find it uncomfortable currently. She has not needed to urinate since the incident and has not had a bowel movement. She reports stomach cramping and back pain.   The patient underwent a partial hysterectomy in 11/2023, which was complicated by dehiscence, necessitating a return to the operating room. Her six-week postoperative checkup revealed no abnormalities. She recalls severe stomach cramping following the closure of her vaginal cuff, which was attributed to silent bowels. She had a C-section previously.     No past medical history on file.  No past surgical history on file.  Social History   Substance and Sexual Activity  Alcohol Use Not on file   Tobacco Use History[1] No existing history information found. Social History   Substance and Sexual Activity  Drug Use Not on file         Allergies[2]  There are no discharge medications for this patient.   Primary Survey  Primary Survey  Review of Systems   Review of Systems Negative except as noted above in HPI  Physical Exam   ED Triage Vitals  BP 04/25/24 1453 150/86  Heart Rate 04/25/24 1453 81  Resp 04/25/24 1535 20  SpO2 04/25/24 1453 100 %  Temp 04/25/24 1535 98.6 F  (37 C)    Physical Exam  Constitutional: She appears well-developed and well-nourished.  HENT:  Head: Normocephalic and atraumatic.  Cardiovascular: Normal rate and regular rhythm.  No audible murmur. No friction rub and gallop.  Pulmonary/Chest: Respiratory effort normal and breath sounds normal.  Abdominal: Soft. There is no abdominal tenderness. There is no guarding.  Genitourinary:    Genitourinary Comments: 5 cm erythematous as pictured within the chart.  Nothing protruding from the rectum.   Musculoskeletal: No obvious deformity noted to extremities. no edema.  Neurological: She is alert and oriented to person, place, and time.  Skin: Skin is warm. Skin is dry.    ED Course   Lab results: No data to display  Imaging: No data to display   ECG: ECG Results   None     Pre-Sedation Procedures    Medical Decision Making Problems Addressed: Dehiscence of vaginal cuff, initial encounter: complicated acute illness or injury  Amount and/or Complexity of Data Reviewed Independent Historian: spouse  Risk Decision regarding hospitalization.   Patient is a 38 year old female with history of recent partial hysterectomy, presenting today with prolapse of tissue from her vagina.  On exam, she is alert and oriented.  She is in regular rate and rhythm.  Lungs clear to auscultation.  Abdominal exam reveals mild diffuse tenderness without any acute guarding or rebound.  She does have tissue protruding from the vagina as pictured within the chart.  Concern for possible vaginal prolapse or tissue dehiscence.  Discussed case with  GYN who request CBC and will be to the bedside to evaluate patient.  On GYN evaluation, concern for vaginal dehiscence.  Patient was taken emergently to the OR.  She is hemodynamically stable and appropriate for transfer to the OR.         Provider Communication  There are no discharge medications for this patient.   There are no discharge  medications for this patient.   There are no discharge medications for this patient.   Clinical Impression Final diagnoses:  Dehiscence of vaginal cuff, initial encounter    ED Disposition     ED Disposition  Admit   Condition  --   Comment  --                   Electronically signed by:       [1] Social History Tobacco Use  Smoking Status Not on file  Smokeless Tobacco Not on file  [2] Allergies Allergen Reactions  . Hydrocodone-Acetaminophen  Itching and Swelling    Pt reports facial swelling and itching.  Pt states she has taken acetaminophen  with no reaction.    Caitlin A Dakermandji, DO 04/25/24 2348

## 2024-04-26 NOTE — Progress Notes (Signed)
 GYN Post-Op Progress Note  Name: Donna [name] MRN: [id number] Date of service: 04/26/2024  Plan of the day  - DC after 1700 dose of ancef    POD#1  Subjective:  Patient doing well. Denies any pain currently. Tolerating PO without nausea/vomiting.  Has ambulated. Voiding without difficulty.   Denies: fevers/chills/CP/SOB.   Objective: Temp:  [97.3 F (36.3 C)-98.6 F (37 C)] 98 F (36.7 C) Heart Rate:  [75-95] 80 Resp:  [10-20] 16 BP: (101-150)/(57-90) 109/66 SpO2:  [100 %] 100 %  Intake/Output Summary (Last 24 hours) at 04/26/2024 0745 Last data filed at 04/25/2024 1824 Gross per 24 hour  Intake 2150 ml  Output 300 ml  Net 1850 ml    Gen:  NAD CV:  RR Resp:  CTAB, nonlabored on room air  Abd:  Soft, appropriately tender, nondistended.  No rebound/guarding.   Ext:  Symmetric, no swelling   Recent Labs    Units 04/26/24 0444  NA mmol/L 135*  K mmol/L 4.8  CL mmol/L 107  CO2 mmol/L 21  BUN mg/dL 9  CREATININE mg/dL 8.99    No results for input(s): AST, ALT, ALB, LIPASE in the last 168 hours.  Invalid input(s): TBIL, DBIL, ALK, TP, PREALB  Recent Labs    Units 04/26/24 0444 04/25/24 1707  WBC thou/mcL 13.4* 10.8*  HGB gm/dL 85.9 84.7  HCT % 57.1 55.2  PLT thou/mcL 265 264    No results for input(s): PT, INR, PTT in the last 168 hours.  No results for input(s): GLUC in the last 24 hours.  Invalid input(s): GLBED   ASSESSMENT/PLAN: Donna [name] is a 38 y.o. female POD#1 s/p repair of vaginal cuff dehiscence, cystoscopy  - FEN: Regular diet. - Pain: Scheduled Tylenol , Toradol >Ibuprofen . PRN Oxycodone . - Heme: Preop Hgb 15.2 > 14  - CV: VSS - Resp: encourage incentive spirometry - GI: Bowel regimen, antiemetics PRN - GU: foley out, voiding spontaneously.  - Renal: Cr 1.0 this AM  - ID: 24h ancef /flagyl for ppx  - Ppx: PPI, encourage ambulation, SCD's  #Disposition: DC after 24h of abx (1700)   Waddell Ardeen Cordia, MD, 04/26/2024, 7:45 AM

## 2024-04-26 NOTE — Anesthesia Postprocedure Evaluation (Signed)
  Patient: Donna [name] Procedure(s): REPAIR OF VAGINAL CUFF DEHISCENCE, CYSTOSCOPY  Anesthesia type: emergency, general  Anesthesia Post Evaluation  Patient location during evaluation: bedside Patient participation: complete - patient participated Level of consciousness: awake and alert Pain management: satisfactory to patient There was medical reason for not using a multimodal analgesia pain management approach. Airway patency: patent There was medical reason for not screening for obstructive sleep apnea and/or not using of two or more mitigation strategies. Cardiovascular status: acceptable and blood pressure returned to baseline Respiratory status: acceptable and nonlabored ventilation Hydration status: acceptable Comments: Pt did well. Vitals: stable Temperature: temperature adequate >96.24F PONV: nausea and vomiting controlled Regional Anesthesia: no block performed    BP: 109/66 (04/26/24 0438) Heart Rate: 80 (04/26/24 0438) Resp: 16 (04/26/24 0438) Temp: 98 F (36.7 C) (04/26/24 0438) SpO2: 100 % (04/26/24 0438) Weight: 72.6 kg (160 lb) (04/25/24 1535) BMI (Calculated): 26.6 (04/25/24 1535)     Notable Events:    No Anesthesia notable events documented.

## 2024-07-07 ENCOUNTER — Ambulatory Visit (HOSPITAL_COMMUNITY): Admitting: Anesthesiology

## 2024-07-07 ENCOUNTER — Inpatient Hospital Stay (HOSPITAL_COMMUNITY)
Admission: AD | Admit: 2024-07-07 | Discharge: 2024-07-10 | DRG: 908 | Disposition: A | Discharge status: Home / Self Care | Source: Ambulatory Visit | Attending: Obstetrics and Gynecology | Admitting: Obstetrics and Gynecology

## 2024-07-07 ENCOUNTER — Other Ambulatory Visit: Payer: Self-pay

## 2024-07-07 ENCOUNTER — Encounter (HOSPITAL_COMMUNITY)
Admission: AD | Disposition: A | Discharge status: Home / Self Care | Payer: Self-pay | Source: Ambulatory Visit | Attending: Obstetrics and Gynecology

## 2024-07-07 ENCOUNTER — Encounter (HOSPITAL_COMMUNITY): Payer: Self-pay | Admitting: Obstetrics and Gynecology

## 2024-07-07 DIAGNOSIS — N939 Abnormal uterine and vaginal bleeding, unspecified: Secondary | ICD-10-CM

## 2024-07-07 DIAGNOSIS — T81328D Disruption or dehiscence of closure of other specified internal operation (surgical) wound, subsequent encounter: Secondary | ICD-10-CM

## 2024-07-07 DIAGNOSIS — Z87891 Personal history of nicotine dependence: Secondary | ICD-10-CM

## 2024-07-07 DIAGNOSIS — T81328A Disruption or dehiscence of closure of other specified internal operation (surgical) wound, initial encounter: Principal | ICD-10-CM | POA: Diagnosis present

## 2024-07-07 DIAGNOSIS — N1370 Vesicoureteral-reflux, unspecified: Secondary | ICD-10-CM | POA: Diagnosis present

## 2024-07-07 DIAGNOSIS — Z881 Allergy status to other antibiotic agents status: Secondary | ICD-10-CM

## 2024-07-07 DIAGNOSIS — Z9350 Unspecified cystostomy status: Secondary | ICD-10-CM

## 2024-07-07 DIAGNOSIS — F4322 Adjustment disorder with anxiety: Secondary | ICD-10-CM | POA: Diagnosis present

## 2024-07-07 DIAGNOSIS — T8131XA Disruption of external operation (surgical) wound, not elsewhere classified, initial encounter: Secondary | ICD-10-CM | POA: Diagnosis not present

## 2024-07-07 DIAGNOSIS — K9189 Other postprocedural complications and disorders of digestive system: Secondary | ICD-10-CM | POA: Diagnosis not present

## 2024-07-07 DIAGNOSIS — K567 Ileus, unspecified: Secondary | ICD-10-CM | POA: Diagnosis not present

## 2024-07-07 DIAGNOSIS — Z885 Allergy status to narcotic agent status: Secondary | ICD-10-CM

## 2024-07-07 HISTORY — PX: BLADDER REPAIR: SHX6721

## 2024-07-07 HISTORY — PX: REPAIR VAGINAL CUFF: SHX6067

## 2024-07-07 HISTORY — PX: CYSTOSCOPY: SHX5120

## 2024-07-07 HISTORY — PX: LAPAROTOMY: SHX154

## 2024-07-07 LAB — CBC
HCT: 43.3 % (ref 36.0–46.0)
Hemoglobin: 15.1 g/dL — ABNORMAL HIGH (ref 12.0–15.0)
MCH: 30.5 pg (ref 26.0–34.0)
MCHC: 34.9 g/dL (ref 30.0–36.0)
MCV: 87.5 fL (ref 80.0–100.0)
Platelets: 272 K/uL (ref 150–400)
RBC: 4.95 MIL/uL (ref 3.87–5.11)
RDW: 11.9 % (ref 11.5–15.5)
WBC: 10.9 K/uL — ABNORMAL HIGH (ref 4.0–10.5)
nRBC: 0 % (ref 0.0–0.2)

## 2024-07-07 LAB — TYPE AND SCREEN
ABO/RH(D): O POS
Antibody Screen: NEGATIVE

## 2024-07-07 LAB — HIV ANTIBODY (ROUTINE TESTING W REFLEX): HIV Screen 4th Generation wRfx: NONREACTIVE

## 2024-07-07 SURGERY — REPAIR, VAGINAL CUFF
Anesthesia: General | Site: Vagina

## 2024-07-07 MED ORDER — FENTANYL CITRATE (PF) 100 MCG/2ML IJ SOLN
INTRAMUSCULAR | Status: AC
  Filled 2024-07-07: qty 2

## 2024-07-07 MED ORDER — ONDANSETRON HCL 4 MG/2ML IJ SOLN: 4.0000 mg | Freq: Four times a day (QID) | INTRAMUSCULAR | Status: DC | PRN

## 2024-07-07 MED ORDER — LIDOCAINE 2% (20 MG/ML) 5 ML SYRINGE
INTRAMUSCULAR | Status: DC | PRN
  Administered 2024-07-07: 60 mg via INTRAVENOUS

## 2024-07-07 MED ORDER — ONDANSETRON HCL 4 MG/2ML IJ SOLN
INTRAMUSCULAR | Status: AC
  Filled 2024-07-07: qty 2

## 2024-07-07 MED ORDER — CHLORHEXIDINE GLUCONATE 0.12 % MT SOLN
15.0000 mL | Freq: Once | OROMUCOSAL | Status: AC
  Administered 2024-07-07: 15 mL via OROMUCOSAL
  Filled 2024-07-07: qty 15

## 2024-07-07 MED ORDER — PROPOFOL 10 MG/ML IV BOLUS
INTRAVENOUS | Status: AC
Start: 2024-07-07 — End: 2024-07-07
  Filled 2024-07-07: qty 20

## 2024-07-07 MED ORDER — AMISULPRIDE (ANTIEMETIC) 5 MG/2ML IV SOLN: 10.0000 mg | Freq: Once | INTRAVENOUS | Status: DC | PRN

## 2024-07-07 MED ORDER — MIDAZOLAM HCL 2 MG/2ML IJ SOLN
INTRAMUSCULAR | Status: AC
  Filled 2024-07-07: qty 2

## 2024-07-07 MED ORDER — FENTANYL CITRATE (PF) 100 MCG/2ML IJ SOLN
INTRAMUSCULAR | Status: DC | PRN
  Administered 2024-07-07: 50 ug via INTRAVENOUS
  Administered 2024-07-07: 100 ug via INTRAVENOUS
  Administered 2024-07-07 (×4): 50 ug via INTRAVENOUS

## 2024-07-07 MED ORDER — FENTANYL CITRATE (PF) 250 MCG/5ML IJ SOLN
INTRAMUSCULAR | Status: AC
  Filled 2024-07-07: qty 5

## 2024-07-07 MED ORDER — SIMETHICONE 80 MG PO CHEW
80.0000 mg | CHEWABLE_TABLET | Freq: Four times a day (QID) | ORAL | Status: DC | PRN
  Administered 2024-07-08: 80 mg via ORAL
  Filled 2024-07-07: qty 1

## 2024-07-07 MED ORDER — HYDROMORPHONE HCL 1 MG/ML IJ SOLN
2.0000 mg | INTRAMUSCULAR | Status: DC | PRN
Start: 2024-07-07 — End: 2024-07-09
  Administered 2024-07-07: 2 mg via INTRAVENOUS
  Filled 2024-07-07: qty 2

## 2024-07-07 MED ORDER — OXYCODONE HCL 5 MG PO TABS: 5.0000 mg | ORAL_TABLET | Freq: Once | ORAL | Status: DC | PRN

## 2024-07-07 MED ORDER — ACETAMINOPHEN 10 MG/ML IV SOLN
1000.0000 mg | Freq: Four times a day (QID) | INTRAVENOUS | Status: AC | PRN
  Administered 2024-07-08: 1000 mg via INTRAVENOUS
  Filled 2024-07-07: qty 100

## 2024-07-07 MED ORDER — ACETAMINOPHEN 500 MG PO TABS
1000.0000 mg | ORAL_TABLET | Freq: Once | ORAL | Status: AC
  Administered 2024-07-07: 1000 mg via ORAL
  Filled 2024-07-07: qty 2

## 2024-07-07 MED ORDER — DEXAMETHASONE SOD PHOSPHATE PF 10 MG/ML IJ SOLN
INTRAMUSCULAR | Status: DC | PRN
  Administered 2024-07-07: 4 mg via INTRAVENOUS

## 2024-07-07 MED ORDER — FENTANYL CITRATE (PF) 100 MCG/2ML IJ SOLN: 25.0000 ug | INTRAMUSCULAR | Status: DC | PRN

## 2024-07-07 MED ORDER — ROCURONIUM BROMIDE 10 MG/ML (PF) SYRINGE
PREFILLED_SYRINGE | INTRAVENOUS | Status: DC | PRN
  Administered 2024-07-07 (×2): 50 mg via INTRAVENOUS

## 2024-07-07 MED ORDER — HYDROMORPHONE HCL 1 MG/ML IJ SOLN
INTRAMUSCULAR | Status: AC
  Filled 2024-07-07: qty 0.5

## 2024-07-07 MED ORDER — KETOROLAC TROMETHAMINE 30 MG/ML IJ SOLN: 30.0000 mg | Freq: Once | INTRAMUSCULAR | Status: DC | PRN

## 2024-07-07 MED ORDER — ROPIVACAINE HCL 5 MG/ML IJ SOLN
INTRAMUSCULAR | Status: AC
  Filled 2024-07-07: qty 30

## 2024-07-07 MED ORDER — METRONIDAZOLE 500 MG/100ML IV SOLN
500.0000 mg | Freq: Two times a day (BID) | INTRAVENOUS | Status: DC
  Administered 2024-07-07: 500 mg via INTRAVENOUS
  Filled 2024-07-07: qty 100

## 2024-07-07 MED ORDER — CEFAZOLIN SODIUM-DEXTROSE 2-4 GM/100ML-% IV SOLN
2.0000 g | INTRAVENOUS | Status: AC
  Administered 2024-07-07: 2 g via INTRAVENOUS
  Filled 2024-07-07: qty 100

## 2024-07-07 MED ORDER — METRONIDAZOLE 500 MG/100ML IV SOLN
500.0000 mg | Freq: Two times a day (BID) | INTRAVENOUS | Status: AC
  Administered 2024-07-08 (×2): 500 mg via INTRAVENOUS
  Filled 2024-07-07 (×2): qty 100

## 2024-07-07 MED ORDER — LACTATED RINGERS IV SOLN: INTRAVENOUS | Status: DC

## 2024-07-07 MED ORDER — SUGAMMADEX SODIUM 200 MG/2ML IV SOLN
INTRAVENOUS | Status: DC | PRN
  Administered 2024-07-07: 146 mg via INTRAVENOUS

## 2024-07-07 MED ORDER — ONDANSETRON HCL 4 MG/2ML IJ SOLN
INTRAMUSCULAR | Status: DC | PRN
  Administered 2024-07-07: 4 mg via INTRAVENOUS

## 2024-07-07 MED ORDER — HYDROMORPHONE HCL 1 MG/ML IJ SOLN
INTRAMUSCULAR | Status: DC | PRN
  Administered 2024-07-07: .5 mg via INTRAVENOUS

## 2024-07-07 MED ORDER — POVIDONE-IODINE 10 % EX SWAB
2.0000 | Freq: Once | CUTANEOUS | Status: AC
  Administered 2024-07-07: 2 via TOPICAL

## 2024-07-07 MED ORDER — CEFAZOLIN SODIUM-DEXTROSE 2-4 GM/100ML-% IV SOLN
2.0000 g | Freq: Three times a day (TID) | INTRAVENOUS | Status: AC
  Administered 2024-07-07 – 2024-07-08 (×3): 2 g via INTRAVENOUS
  Filled 2024-07-07 (×3): qty 100

## 2024-07-07 MED ORDER — PHENYLEPHRINE HCL-NACL 20-0.9 MG/250ML-% IV SOLN
INTRAVENOUS | Status: AC
  Filled 2024-07-07: qty 250

## 2024-07-07 MED ORDER — MENTHOL 3 MG MT LOZG: 1.0000 | LOZENGE | OROMUCOSAL | Status: DC | PRN

## 2024-07-07 MED ORDER — KETOROLAC TROMETHAMINE 30 MG/ML IJ SOLN
30.0000 mg | Freq: Four times a day (QID) | INTRAMUSCULAR | Status: DC
  Administered 2024-07-07 – 2024-07-09 (×8): 30 mg via INTRAVENOUS
  Filled 2024-07-07 (×8): qty 1

## 2024-07-07 MED ORDER — PROPOFOL 1000 MG/100ML IV EMUL
INTRAVENOUS | Status: AC
  Filled 2024-07-07: qty 500

## 2024-07-07 MED ORDER — PROPOFOL 10 MG/ML IV BOLUS
INTRAVENOUS | Status: DC | PRN
  Administered 2024-07-07: 200 mg via INTRAVENOUS
  Administered 2024-07-07: 150 ug/kg/min via INTRAVENOUS

## 2024-07-07 MED ORDER — METHYLENE BLUE 20 MG/2ML IV SOSY
PREFILLED_SYRINGE | INTRAVENOUS | Status: AC
  Filled 2024-07-07: qty 2

## 2024-07-07 MED ORDER — SCOPOLAMINE 1 MG/3DAYS TD PT72
1.0000 | MEDICATED_PATCH | TRANSDERMAL | Status: DC
  Administered 2024-07-07: 1 mg via TRANSDERMAL

## 2024-07-07 MED ORDER — ONDANSETRON HCL 4 MG PO TABS: 4.0000 mg | ORAL_TABLET | Freq: Four times a day (QID) | ORAL | Status: DC | PRN

## 2024-07-07 MED ORDER — PROPOFOL 1000 MG/100ML IV EMUL
INTRAVENOUS | Status: AC
  Filled 2024-07-07: qty 200

## 2024-07-07 MED ORDER — BUPIVACAINE HCL (PF) 0.25 % IJ SOLN
INTRAMUSCULAR | Status: DC | PRN
  Administered 2024-07-07: 10 mL

## 2024-07-07 MED ORDER — MIDAZOLAM HCL (PF) 2 MG/2ML IJ SOLN
INTRAMUSCULAR | Status: DC | PRN
  Administered 2024-07-07: 2 mg via INTRAVENOUS

## 2024-07-07 MED ORDER — BUPIVACAINE HCL (PF) 0.25 % IJ SOLN
INTRAMUSCULAR | Status: AC
  Filled 2024-07-07: qty 30

## 2024-07-07 MED ORDER — ORAL CARE MOUTH RINSE: 15.0000 mL | Freq: Once | OROMUCOSAL | Status: AC

## 2024-07-07 MED ORDER — 0.9 % SODIUM CHLORIDE (POUR BTL) OPTIME
TOPICAL | Status: DC | PRN
  Administered 2024-07-07: 3000 mL
  Administered 2024-07-07: 1000 mL

## 2024-07-07 MED ORDER — ALBUMIN HUMAN 5 % IV SOLN: INTRAVENOUS | Status: DC | PRN

## 2024-07-07 MED ORDER — ROCURONIUM BROMIDE 10 MG/ML (PF) SYRINGE
PREFILLED_SYRINGE | INTRAVENOUS | Status: AC
  Filled 2024-07-07: qty 10

## 2024-07-07 MED ORDER — DEXMEDETOMIDINE HCL IN NACL 80 MCG/20ML IV SOLN
INTRAVENOUS | Status: DC | PRN
  Administered 2024-07-07 (×2): 8 ug via INTRAVENOUS

## 2024-07-07 MED ORDER — OXYCODONE HCL 5 MG/5ML PO SOLN: 5.0000 mg | Freq: Once | ORAL | Status: DC | PRN

## 2024-07-07 MED ORDER — SCOPOLAMINE 1 MG/3DAYS TD PT72
MEDICATED_PATCH | TRANSDERMAL | Status: AC
  Filled 2024-07-07: qty 1

## 2024-07-07 MED ORDER — KETOROLAC TROMETHAMINE 30 MG/ML IJ SOLN
INTRAMUSCULAR | Status: DC | PRN
  Administered 2024-07-07: 30 mg via INTRAVENOUS

## 2024-07-07 MED ORDER — LIDOCAINE 2% (20 MG/ML) 5 ML SYRINGE
INTRAMUSCULAR | Status: AC
  Filled 2024-07-07: qty 5

## 2024-07-07 SURGICAL SUPPLY — 32 items
BENZOIN TINCTURE PRP APPL 2/3 (GAUZE/BANDAGES/DRESSINGS) IMPLANT
DISSECTOR ROUND CHERRY 3/8 STR (MISCELLANEOUS) IMPLANT
DRAPE STERI URO 9X17 APER PCH (DRAPES) ×4 IMPLANT
DRSG OPSITE POSTOP 4X10 (GAUZE/BANDAGES/DRESSINGS) IMPLANT
GAUZE PACKING 1 X5 YD ST (GAUZE/BANDAGES/DRESSINGS) ×4 IMPLANT
GLOVE ECLIPSE 6.5 STRL STRAW (GLOVE) ×4 IMPLANT
GLOVE SURG UNDER POLY LF SZ7 (GLOVE) ×8 IMPLANT
GOWN STRL REUS W/ TWL LRG LVL3 (GOWN DISPOSABLE) ×8 IMPLANT
KIT TURNOVER KIT B (KITS) ×4 IMPLANT
NDL HYPO 22X1.5 SAFETY MO (MISCELLANEOUS) IMPLANT
NEEDLE HYPO 22X1.5 SAFETY MO (MISCELLANEOUS) ×4 IMPLANT
PACK VAGINAL WOMENS (CUSTOM PROCEDURE TRAY) ×4 IMPLANT
PAD OB MATERNITY 11 LF (PERSONAL CARE ITEMS) ×4 IMPLANT
RETRACTOR WND ALEXIS 25 LRG (MISCELLANEOUS) IMPLANT
SET CYSTO IRRIGATION (SET/KITS/TRAYS/PACK) IMPLANT
SOLN 0.9% NACL POUR BTL 1000ML (IV SOLUTION) ×4 IMPLANT
SPIKE FLUID TRANSFER (MISCELLANEOUS) IMPLANT
SPONGE T-LAP 18X18 ~~LOC~~+RFID (SPONGE) IMPLANT
SPONGE T-LAP 18X36 ~~LOC~~+RFID STR (SPONGE) IMPLANT
STRIP CLOSURE SKIN 1/2X4 (GAUZE/BANDAGES/DRESSINGS) IMPLANT
SUT MNCRL AB 3-0 PS2 27 (SUTURE) IMPLANT
SUT PDS AB 0 CT1 27 (SUTURE) IMPLANT
SUT PROLENE 0 CT 1 30 (SUTURE) IMPLANT
SUT VIC AB 0 CT1 18XCR BRD8 (SUTURE) ×12 IMPLANT
SUT VIC AB 0 CT1 27XBRD ANBCTR (SUTURE) ×8 IMPLANT
SUT VIC AB 2-0 CT1 TAPERPNT 27 (SUTURE) IMPLANT
SUT VIC AB 3-0 SH 27X BRD (SUTURE) IMPLANT
SUT VICRYL 0 TIES 12 18 (SUTURE) ×4 IMPLANT
SYR 50ML SLIP (SYRINGE) IMPLANT
TOWEL GREEN STERILE FF (TOWEL DISPOSABLE) ×4 IMPLANT
TRAY FOLEY W/BAG SLVR 14FR (SET/KITS/TRAYS/PACK) ×4 IMPLANT
UNDERPAD 30X36 HEAVY ABSORB (UNDERPADS AND DIAPERS) ×4 IMPLANT

## 2024-07-07 NOTE — Transfer of Care (Signed)
 Immediate Anesthesia Transfer of Care Note  Patient: Donna [name]  Procedure(s) Performed: REPAIR, VAGINAL CUFF (Vagina ) LAPAROTOMY, EXPLORATORY (Abdomen) CYSTOSCOPY (Bladder) REPAIR, BLADDER  Patient Location: PACU  Anesthesia Type:General  Level of Consciousness: drowsy  Airway & Oxygen Therapy: Patient Spontanous Breathing and Patient connected to face mask oxygen  Post-op Assessment: Report given to RN and Post -op Vital signs reviewed and stable  Post vital signs: Reviewed and stable  Last Vitals:  Vitals Value Taken Time  BP 111/55 07/07/24 18:00  Temp 36.6 C 07/07/24 18:00  Pulse 81 07/07/24 18:11  Resp 12 07/07/24 18:11  SpO2 100 % 07/07/24 18:11  Vitals shown include unfiled device data.  Last Pain:  Vitals:   07/07/24 1800  TempSrc:   PainSc: Asleep         Complications: No notable events documented.

## 2024-07-07 NOTE — Op Note (Signed)
 Pre-op diagnosis: Recurrent vaginal cuff dehiscence with evisceration  Postop diagnosis: Same with incidental cystotomy  Procedure: Expiratory laparotomy, closure of vaginal cuff, closure of incidental cystostomy (Dr. Rosaline Caper) and cystoscopy (Dr. Rosaline Caper)  Surgeon: Dr. Darcel  Co-surgeon: Dr. Rosaline Caper  Assistant: Madolyn Monte PA-C  Estimated blood loss: 100 cc  Procedure: This is a 38 year old patient who was seen in the office this morning complaining of vaginal bleeding as well as abdominal pain with bloating and nausea who presented with vaginal cuff dehiscence and what appeared to be bowel loops in the vagina.  Her vagina was packed with moist compresses and patient was transported via EMS to [facility] the OR  After being informed of the planned procedure with possible complications, informed consent was obtained.  The patient was taken to the OR #7 and placed in the lithotomy position.  She was given general anesthesia with endotracheal intubation without any complication.  We proceeded with prepping of the bulbar area and removed the vaginal packings.  Omentum and bowel loop is still visible at the apex.  The vagina was irrigated with warm saline.  A Foley balloon inflated with 40 cc of saline was placed in the lower part of the vagina.  Patient was then prepped and draped in a sterile fashion and a Foley catheter was inserted in her bladder.  We proceeded with a Pfannenstiel incision which was brought down sharply to the fascia.  The fascia is incised in a low transverse fashion.  Linea alba is dissected.  Peritoneum is entered bluntly.  Alexis retractor is easily position.  With Trendelenburg we are able to move the bowels in the upper abdomen and place packings.  We observe a completely open vaginal cuff with no active bleeding and no signs of active infection.  Oh Dr. Donnice Bury general surgeon was consulted intraoperatively and confirmed that  only the omentum was trapped in the vagina.  There is a small area of edematous omentum with no evidence of bowel loops involved in the evisceration.  We repacked the bowels in the upper abdomen grasped the vaginal cuff with Allis clamps.  We proceed with sharp dissection of the bladder from the anterior vaginal wall during which a 2 cm incidental cystostomy occurred.  Dr. Rosaline Caper already in the room as a consultant was asked to scrub in and assist with the rest of the surgery.  Using the cystostomy as guide we are able to sharply dissect the bladder away from the anterior vaginal wall to allow for excision of a vaginal margin.  Dr. Caper proceeds with closure of the cystostomy (please see separate operative report).  We irrigated profusely with warm saline.  We then proceeded with sharp excision of 1 cm of the vaginal mucosa leading to a full-thickness vaginal wall with active bleeding.  The cuff is closed using figure-of-eight's stitches of 0 PDS.  We then proceeded with a secondary layer with a running suture of 0 Prolene imbricating the first closure.  We irrigated profusely and confirm a satisfactory hemostasis.  Packings are removed.  The omentum is evaluated and shows reduction in the edematous area.  The small bowel is then ran to confirm no injury.  We irrigated profusely with warm saline and removed packings as well as retractor.  We proceeded with closure of the abdominal wall.  The under fascial hemostasis was completed with cauterization.  The fascia was closed with 2 running sutures of 0 PDS meeting midline.  The wound is irrigated  profusely with warm saline.  Hemostasis is completed with cauterization and the subcutaneous layer is closed with interrupted sutures of 0 Vicryl.  Skin is then closed with a subcuticular suture of 3-0 Monocryl and Steri-Strips.  A cystoscopy performed by Dr. Marilynne confirms a adequate closure of the cystostomy, absence of suture material in the  bladder and to ureteral jets.  A speculum is inserted in the vagina to confirm adequate vaginal side closure as well as vaginal hemostasis.  Estimated blood loss is 100 cc.  No specimen is sent.  The procedure was well-tolerated by the patient was taken to recovery room in a well in stable condition.  Nena App MD

## 2024-07-07 NOTE — Progress Notes (Signed)
 Asked to see patient intraoperatively for vaginal cuff dehiscence and possible bowel involvement. Being explored by her gynecologist.  She shows me what was incarcerated was actually omentum and is viable. Nothing further to do from a general surgical standpoint.

## 2024-07-07 NOTE — Op Note (Signed)
 Urogynecology Operative Note  Preoperative Diagnosis: vaginal cuff dehiscence, cystotomy  Postoperative Diagnosis: same  Procedures performed:  Repair of bladder cystotomy, cystoscopy  Implants: none  Attending Surgeon: Rosaline Caper, MD  Anesthesia: General endotracheal  Findings: 2cm cystotomy in posterior bladder wall as a result of dissection of bladder flap from the cuff.   Specimens: none  Estimated blood loss: 10 mL  IV fluids: 250 mL  Urine output: see flowsheet  Complications: none  Procedure in Detail:  Preoperative consultation was requested from Dr. Darcel due to cystotomy after dissection of the vesicovaginal space next to the vaginal cuff dehiscence.  The edges of the cystotomy were grasped with Allis clamps.  The cuff was also grasped with Allis clamps.  The vesicovaginal space was further dissected with Metzenbaum scissors, until there was several centimeters from the cuff edge. The bladder mucosa was then closed with a 3-0 vicryl in a running fashion. A second layer of 3-0 vicryl imbricated the first. The bladder was then backfilled with saline and methylene blue. A thin area of mucosa next to the repaired area was leaking fluid. The bladder was drained and this was reinforced with 2 layers of 3-0 vicryl suture. The bladder was again backfilled with and no extravasation noted. Dr Darcel then proceeded to repair the cuff, please see separate operate note for details.    The foley catheter was removed. Cystoscopy was then performed with 70 degree cystoscope.  A 360 degree view of the bladder was obtained.  There was a subcentimeter size clots along with the repair in the posterior bladder wall.  The remainder of the mucosa appeared normal.  Brisk bilateral ureteral reflux was present.  The cystoscope was removed and a Foley catheter was replaced. Dr Darcel continued with abdominal closure.   Rosaline LOISE Caper, MD

## 2024-07-07 NOTE — Anesthesia Preprocedure Evaluation (Addendum)
 Anesthesia Evaluation  Patient identified by MRN, date of birth, ID band Patient awake    Reviewed: Allergy & Precautions, NPO status , Patient's Chart, lab work & pertinent test results  Airway Mallampati: II  TM Distance: >3 FB Neck ROM: Full    Dental no notable dental hx.    Pulmonary former smoker   Pulmonary exam normal        Cardiovascular negative cardio ROS Normal cardiovascular exam     Neuro/Psych negative neurological ROS  negative psych ROS   GI/Hepatic negative GI ROS, Neg liver ROS,,,  Endo/Other  negative endocrine ROS    Renal/GU negative Renal ROS     Musculoskeletal negative musculoskeletal ROS (+)    Abdominal   Peds  Hematology negative hematology ROS (+)   Anesthesia Other Findings BLEEDING  Reproductive/Obstetrics                              Anesthesia Physical Anesthesia Plan  ASA: 2  Anesthesia Plan: General   Post-op Pain Management:    Induction: Intravenous  PONV Risk Score and Plan: 3 and Ondansetron , Dexamethasone , Midazolam , Treatment may vary due to age or medical condition and Scopolamine  patch - Pre-op  Airway Management Planned: Oral ETT  Additional Equipment:   Intra-op Plan:   Post-operative Plan: Extubation in OR  Informed Consent: I have reviewed the patients History and Physical, chart, labs and discussed the procedure including the risks, benefits and alternatives for the proposed anesthesia with the patient or authorized representative who has indicated his/her understanding and acceptance.     Dental advisory given  Plan Discussed with: CRNA  Anesthesia Plan Comments:          Anesthesia Quick Evaluation

## 2024-07-07 NOTE — Anesthesia Procedure Notes (Signed)
 Procedure Name: Intubation Date/Time: 07/07/2024 3:10 PM  Performed by: Patrisha Bernardino SQUIBB, MDPre-anesthesia Checklist: Patient identified, Emergency Drugs available, Suction available and Patient being monitored Patient Re-evaluated:Patient Re-evaluated prior to induction Oxygen Delivery Method: Circle system utilized Preoxygenation: Pre-oxygenation with 100% oxygen Induction Type: IV induction Ventilation: Mask ventilation without difficulty Laryngoscope Size: Mac and 4 Grade View: Grade I Tube type: Oral Tube size: 7.0 mm Number of attempts: 1 Airway Equipment and Method: Stylet and Oral airway Placement Confirmation: ETT inserted through vocal cords under direct vision, positive ETCO2 and breath sounds checked- equal and bilateral Secured at: 22 cm Tube secured with: Tape Dental Injury: Teeth and Oropharynx as per pre-operative assessment

## 2024-07-07 NOTE — H&P (Addendum)
 Donna [name] is a 38 y.o. female [phone] who presents for repair of vaginal cuff dehiscence and bowel evisceration.  Patient is S/P Robot Assisted Hysterectomy performed 11/14/2023  apparent normal healing of her vaginal cuff at 6 weeks. Patient returned at 8 weeks with complaints of pelvic pain and was found to have a cuff hematoma,that was confirmed resolved by CT scan 02/04/24. Following intercourse the patient returned 02/27/2024 with pelvic pain and vaginal cuff dehiscence that was subsequently closed laparoscopically and also included lysis of adhesions. She was given a 12 week restriction for pelvic rest. Post operative visit from surgery on 03/16/24  and 04/13/24 showed well healed vaginal cuff.  Patient reported that she had emergency (vaginal) repair of her vaginal cuff in [place], [place]  in August 2025 8 weeks following the second closure for vaginal dehiscence and evisceration.  She was also given Doxycycline/Metronidazole antibiotics following that procedure. She was again seen in our office on 05/12/2024 with normal healing of the vaginal cuff and again given an 8 week pelvic rest restriction.  Due to her recurrence poor vaginal cuff healing the patient has been referred for evaluation to rule out a connective tissue disorder.  Patient presented today,  after intercourse last night with complaints of dyspepsia, bloating and a sensation of something coming out of her vagina.  Upon examination patient was found to have bowel evisceration through her vaginal orifice.  Patient denies any pain, fever, urinary tract symptoms or nausea. Transferred for emergency surgery.     Past Medical History  OB History: [id number]: SVB -2014 and C-Section-2-10 and 2017  GYN History: menarche:38 yo;  Last PAP smear: 2022  Medical History: Shoulder Fracture  Surgical History: 2025 Robot Assisted Hysterectomy (see HPI for details of interim surgeries) Denies problems with anesthesia or history of blood  transfusions  Family History: Breast Cancer, Hypertension, Dementia, Asthma, Seizure, TB and Eczema  Social History:  Separated and Substance Abuse Counselor;  Former smoker and occasional alcohol  Medications:  Allergies  Allergen Reactions   Ciprofloxacin Itching and Swelling   Hydrocodone Hives, Itching and Swelling    ROS: Admits to feeling bloated but denies headache, vision changes, nasal congestion, dysphagia, tinnitus, dizziness, hoarseness, cough,  chest pain, shortness of breath, nausea, vomiting, diarrhea,constipation,  urinary frequency, urgency  dysuria, hematuria, vaginitis symptoms, pelvic pain, swelling of joints,easy bruising,  myalgias, arthralgias, skin rashes, unexplained weight loss and except as is mentioned in the history of present illness, patient's review of systems is otherwise negative.    Physical Exam Temperature:  99.2 degrees F orally;  Bp: 122/80;  Weight 161 lbs.;  Height: 5'5;  BMI: 26.8  Neck: supple without masses or thyromegaly Lungs: clear to auscultation Heart: regular rate and rhythm Abdomen: soft, non-tender and no organomegaly Pelvic:EGBUS- wnl; vagina-bowels present Extremities:  no clubbing, cyanosis or edema   Assesment: Vaginal Cuff Dehiscence with Evisceration   Disposition:  A discussion was held with patient regarding the indication for her procedure(s) along with the risks, which include but are not limited to: reaction to anesthesia, damage to adjacent organs, infection and excessive bleeding. The patient verbalized understanding of these risks and has consented to undergo Closure of Vaginal Cuff Dehiscence with Bowel Prolapse at [facility] on 07/07/24.    CSN# [id number]   Halea Lieb J. Perri, PA-C  for Dr. Nena LABOR. [place]

## 2024-07-08 ENCOUNTER — Encounter (HOSPITAL_COMMUNITY): Payer: Self-pay | Admitting: Obstetrics and Gynecology

## 2024-07-08 DIAGNOSIS — Z87891 Personal history of nicotine dependence: Secondary | ICD-10-CM | POA: Diagnosis not present

## 2024-07-08 DIAGNOSIS — T81328A Disruption or dehiscence of closure of other specified internal operation (surgical) wound, initial encounter: Secondary | ICD-10-CM | POA: Diagnosis present

## 2024-07-08 DIAGNOSIS — Z881 Allergy status to other antibiotic agents status: Secondary | ICD-10-CM | POA: Diagnosis not present

## 2024-07-08 DIAGNOSIS — N1370 Vesicoureteral-reflux, unspecified: Secondary | ICD-10-CM | POA: Diagnosis present

## 2024-07-08 DIAGNOSIS — K567 Ileus, unspecified: Secondary | ICD-10-CM | POA: Diagnosis not present

## 2024-07-08 DIAGNOSIS — K9189 Other postprocedural complications and disorders of digestive system: Secondary | ICD-10-CM | POA: Diagnosis not present

## 2024-07-08 DIAGNOSIS — Z885 Allergy status to narcotic agent status: Secondary | ICD-10-CM | POA: Diagnosis not present

## 2024-07-08 DIAGNOSIS — F4322 Adjustment disorder with anxiety: Secondary | ICD-10-CM | POA: Diagnosis present

## 2024-07-08 LAB — CBC
HCT: 34.7 % — ABNORMAL LOW (ref 36.0–46.0)
Hemoglobin: 12.3 g/dL (ref 12.0–15.0)
MCH: 30.8 pg (ref 26.0–34.0)
MCHC: 35.4 g/dL (ref 30.0–36.0)
MCV: 86.8 fL (ref 80.0–100.0)
Platelets: 225 K/uL (ref 150–400)
RBC: 4 MIL/uL (ref 3.87–5.11)
RDW: 11.9 % (ref 11.5–15.5)
WBC: 11.8 K/uL — ABNORMAL HIGH (ref 4.0–10.5)
nRBC: 0 % (ref 0.0–0.2)

## 2024-07-08 MED ORDER — ZOLPIDEM TARTRATE 5 MG PO TABS
5.0000 mg | ORAL_TABLET | Freq: Once | ORAL | Status: AC
  Administered 2024-07-08: 5 mg via ORAL
  Filled 2024-07-08: qty 1

## 2024-07-08 MED ORDER — LACTATED RINGERS IV BOLUS
500.0000 mL | Freq: Once | INTRAVENOUS | Status: AC
  Administered 2024-07-08: 500 mL via INTRAVENOUS

## 2024-07-08 NOTE — Progress Notes (Addendum)
 Donna [name] is a37 y.o.  [id number]  Post Op Date # 1: Repair of Vaginal Cuff Dehiscence & Incidental Cystotomy  Subjective: Patient is Doing well postoperatively. Patient has pain related to back but has responded well to heat and hydromorphone .  Unable to ambulate currently due to dizziness with standing but can sit without dizziness.  Tolerating ice chips/sips of water .   Objective: Vital signs in last 24 hours: Temp:  [97.9 F (36.6 C)-98.8 F (37.1 C)] 98.2 F (36.8 C) (10/29 0205) Pulse Rate:  [66-90] 82 (10/29 0606) Resp:  [12-20] 18 (10/29 0606) BP: (97-132)/(46-86) 102/51 (10/29 0606) SpO2:  [95 %-100 %] 99 % (10/29 0606) Weight:  [73 kg] 73 kg (10/28 1320)  Intake/Output from previous day: 10/28 0701 - 10/29 0700 In: 2517 [I.V.:1809.1] Out: 700 [Urine:600] Intake/Output this shift: No intake/output data recorded. Recent Labs  Lab 07/07/24 1349 07/08/24 0538  WBC 10.9* 11.8*  HGB 15.1* 12.3  HCT 43.3 34.7*  PLT 272 225    No results for input(s): NA, K, CL, CO2, BUN, CREATININE, CALCIUM, PROT, BILITOT, ALKPHOS, ALT, AST, GLUCOSE in the last 168 hours.  Invalid input(s): LABALBU  EXAM: General: alert, cooperative, and no distress Resp: clear to auscultation bilaterally Cardio: regular rate and rhythm, S1, S2 normal, no murmur, click, rub or gallop GI: abdomen soft and no audible bowel sounds Extremities: Homans sign is negative, no sign of DVT and no calf tenderness  [Addendum to GI assessment: wound dressing is clean/dry/intact with no visible bleeding or evidence of infection seen through honeycomb windows.] Assessment: s/p Procedure(s): REPAIR, VAGINAL CUFF LAPAROTOMY, EXPLORATORY CYSTOSCOPY REPAIR, BLADDER: stable and ileus present  Plan: Encourage ambulation Continue foley due to s/p bladder repair  Continue ice chips, ice popsicles, sips of water  and jello only Routine care  LOS: 1 day    Madolyn Monte,  PA-C 07/08/2024 7:27 AM  12:45 Patient seen. Op findings reviewed. Questions answered. Reports no pain other than abdominal tenderness and cramping. Back pain is still bothersome and has been for months - followed by ortho Denies nausea. Tolerates po fluids.No flatus. Was able to walk in the room. Urine output is normal and urine is clear.  Bowel sounds diminished but present x4 Abdomen slightly distended  Will advance diet in the morning and if tolerated will change all meds to po

## 2024-07-08 NOTE — Anesthesia Postprocedure Evaluation (Signed)
 Anesthesia Post Note  Patient: Donna [name]  Procedure(s) Performed: REPAIR, VAGINAL CUFF (Vagina ) LAPAROTOMY, EXPLORATORY (Abdomen) CYSTOSCOPY (Bladder) REPAIR, BLADDER     Patient location during evaluation: PACU Anesthesia Type: General Level of consciousness: awake Pain management: pain level controlled Vital Signs Assessment: post-procedure vital signs reviewed and stable Respiratory status: spontaneous breathing, nonlabored ventilation and respiratory function stable Cardiovascular status: blood pressure returned to baseline and stable Postop Assessment: no apparent nausea or vomiting Anesthetic complications: no   No notable events documented.  Last Vitals:  Vitals:   07/08/24 1223 07/08/24 1713  BP: (!) 105/50 (!) 111/59  Pulse: 86 88  Resp: 16 17  Temp: 36.9 C 37.1 C  SpO2: 100% 100%    Last Pain:  Vitals:   07/08/24 1713  TempSrc: Oral  PainSc:    Pain Goal:                   Donna [name]

## 2024-07-08 NOTE — Consult Note (Addendum)
 [facility] Psychiatric Consult Initial  Patient Name: .Donna [name]  MRN: [id number]  DOB: 1986/09/02  Consult Order details:  Orders (From admission, onward)     Start     Ordered   07/07/24 2018  IP CONSULT TO PSYCHIATRY       Ordering Provider: Perri Bjork, PA-C  Provider:  (Not yet assigned)  Question Answer Comment  Location [facility]   Reason for Consult? History of PTSD with 3 surgeries in past 5 months for recurrent wound dehiscence causing significant duress.      07/07/24 2017             Mode of Visit: In person   Psychiatry Consult Evaluation  Service Date: July 08, 2024 LOS:  LOS: 1 day  Chief Complaint I'm terrified  Primary Psychiatric Diagnoses  Adjustment disorder with anxiety  Assessment  Donna [name] is a 38 y.o. female admitted: Presented to the ED for 07/07/2024  1:12 PM for vaginal cuff dehiscence and bowel evisceration. 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  Her current presentation of severe distress as a reaction to an identifiable stress inducing event is most consistent with adjustment disorder. She is not currently on any outpatient psychiatric medications. On initial examination, patient is logical with tearful affect and congruent mood, while expressing grievances with multiple surgeries addressing the same issue.  Will make no medication recommendations at this time, as patient is not interested in medications and no clear indication for psychotropics at this time, and is pursuing counseling outpatient. She does not meet inpatient psychiatry or IVC criteria. Please see plan below for detailed recommendations.   Diagnoses:  Active Hospital problems: Principal Problem:   Vaginal cuff dehiscence Active Problems:   Vaginal cuff dehiscence, subsequent encounter   Plan   ## Psychiatric Medication Recommendations:  - Patient  indicated that she would not like to start a psychiatric medication at this time, counseled patient on CBT and trauma focused therapy  ## Medical Decision Making Capacity: Not specifically addressed in this encounter  ## Further Work-up:  -- per primary  ## Disposition:-- There are no psychiatric contraindications to discharge at this time  ## Behavioral / Environmental: - No specific recommendations at this time.     ## Safety and Observation Level:  - Based on my clinical evaluation, I estimate the patient to be at minimal risk of self harm in the current setting.   CSSR Risk Category:minimal  Suicide Risk Assessment: Patient has following modifiable risk factors for suicide: none Patient has following non-modifiable or demographic risk factors for suicide: separation or divorce Patient has the following protective factors against suicide: Access to outpatient mental health care, Supportive family, Minor children in the home, Frustration tolerance, no history of suicide attempts, and no history of NSSIB  Thank you for this consult request. Recommendations have been communicated to the primary team.  We will sign off at this time.   Alfornia Light, DO     History of Present Illness  Relevant Aspects of Hospital ED Course:  Admitted on 07/07/2024 for vaginal cuff dehiscence and bowel evisceration.   Patient Report:  Patient in bed with mother and sister at bedside.  Patient says this is her fourth surgery since March for the same thing, and that she is terrified, scared, sad, nervous, angry.  Patient recalls she was doing okay following the first surgery, however when her vaginal cuff dehisced  and August, it left her mentally traumatized.  Patient says that she sat on the toilet and saw part of her omentum come through her vaginal orifice.  Patient says that this visual has replayed in her head several times, though denies feeling like she is reexperiencing this event, or having  flashbacks to it.  Patient also endorsed avoiding chicken since this incident happened, as this part of her omentum reminded her of a piece of chicken.  Patient denies having nightmares, hypervigilance or other and intrusive symptoms.  Patient says that she has been able to eat chicken since, and issues with replaying this event in her head, had completely resolved, until yesterday.  Patient says that she had felt some generalized discomfort in her abdomen, and felt her bowel eviscerating once again.  Patient was at doctor's office, when she felt this fall out on the table and had an episode.  Patient describes this episode as a reasonable emotional reaction to recurrent vaginal cuff dehiscence.  Patient says that she has been going to a counselor since August, and this has been very helpful.  Patient says that she is not interested in medications at this time, and believes she needs time to process this, and would like to continue counseling.  Patient was counseled on thinking about happy dreams before bedtime, leading to reduction in nightmares.  Patient was also counseled on safety when using Xanax, and advised not to operate any motor vehicle while under the influence of this drug because it could be highly sedating, patient voiced understanding.  Psych ROS:  Depression: Endorses Anxiety: Denies Mania (lifetime and current): Denies Psychosis: (lifetime and current): Denies  Collateral information:  None  ROS  Denies SI/HI/AVH  Psychiatric and Social History  Psychiatric History:  Information collected from patient  Prev Dx/Sx: Postpartum depression, about 10 years ago Current Psych Provider: Denies Home Meds (current): Xanax as needed, patient endorses having only taken 1 of these since it was prescribed Previous Med Trials: Denies Therapy: Yes, since 04/2024, has been 4 times, frequents every other week  Prior Psych Hospitalization: Denies Prior Self Harm: Denies  Social  History:  Occupational Hx: Substance abuse counselor Living Situation: Lives in an apartment with her 3 children ages 37, 26 & 62 Spiritual Hx: Unknown Access to weapons/lethal means: Denies  Substance History Alcohol: Denies Tobacco: Denies Illicit drugs: Denies  Exam Findings  Physical Exam:  Vital Signs:  Temp:  [97.9 F (36.6 C)-98.6 F (37 C)] 98.4 F (36.9 C) (10/29 1223) Pulse Rate:  [66-90] 86 (10/29 1223) Resp:  [12-20] 16 (10/29 1223) BP: (97-119)/(46-86) 105/50 (10/29 1223) SpO2:  [95 %-100 %] 100 % (10/29 1223) Blood pressure (!) 105/50, pulse 86, temperature 98.4 F (36.9 C), temperature source Oral, resp. rate 16, height 5' 5 (1.651 m), weight 73 kg, last menstrual period 11/28/2022, SpO2 100%. Body mass index is 26.79 kg/m.  Mental Status Exam: General Appearance: Casual and Fairly Groomed  Orientation:  Full (Time, Place, and Person)  Memory:  Immediate;   Good Recent;   Good  Concentration:  Concentration: Good  Recall:  Good  Attention  Good  Eye Contact:  Good  Speech:  Clear and Coherent and Normal Rate  Language:  Good  Volume:  Normal  Mood: terrified, scared, sad, nervous, angry  Affect:  Tearful  Thought Process:  Coherent, Goal Directed, and Linear  Thought Content:  WDL  Suicidal Thoughts:  No  Homicidal Thoughts:  No  Judgement:  Good  Insight:  Good  Psychomotor Activity:  Normal  Akathisia:  NA  Fund of Knowledge:  Good      Assets:  Communication Skills Desire for Improvement Housing Intimacy Physical Health Resilience Social Support Vocational/Educational  Cognition:  WNL  ADL's:  Intact  AIMS (if indicated):        Other History   These have been pulled in through the EMR, reviewed, and updated if appropriate.  Family History:  The patient's family history includes Asthma in her son; Cancer in her father and paternal aunt; Early death (age of onset: 5) in her maternal uncle; Eczema in her son; Hypertension in her  maternal grandmother and mother; Seizures in her maternal uncle; Tuberculosis in her maternal grandmother.  Medical History: Past Medical History:  Diagnosis Date   Hematometra    History of abnormal cervical Pap smear 09/10/2010   COLPO   History of herpes genitalis    Pelvic pain    Wears contact lenses     Surgical History: Past Surgical History:  Procedure Laterality Date   BLADDER REPAIR  07/07/2024   Procedure: REPAIR, BLADDER;  Surgeon: Marilynne Rosaline SAILOR, MD;  Location: [facility] OR;  Service: Gynecology;;   BREAST MASS EXCISION Left 03/19/2003   @[facility]  by dr shaunna. ballen   CESAREAN SECTION  05/28/2009   @[facility]  by dr n. dillard   CESAREAN SECTION N/A 02/27/2016   Procedure: CESAREAN SECTION;  Surgeon: Delon Prude, DO;  Location: [facility];  Service: Obstetrics;  Laterality: N/A;   WITH BILATERAL TUBAL LIGATION   CYSTOSCOPY  02/27/2024   Procedure: CYSTOSCOPY;  Surgeon: Darcel Pool, MD;  Location: [facility] OR;  Service: Gynecology;;   CYSTOSCOPY N/A 07/07/2024   Procedure: CYSTOSCOPY;  Surgeon: Darcel Pool, MD;  Location: [facility] OR;  Service: Gynecology;  Laterality: N/A;   HYSTEROSCOPY WITH NOVASURE N/A 12/20/2022   Procedure: DILATATION AND CURETTAGE; HYSTEROSCOPY WITH NOVASURE;  Surgeon: Darcel Pool, MD;  Location: [facility];  Service: Gynecology;  Laterality: N/A;   LAPAROSCOPY N/A 02/27/2024   Procedure: LAPAROSCOPY, DIAGNOSTIC;  Surgeon: Darcel Pool, MD;  Location: [facility] OR;  Service: Gynecology;  Laterality: N/A;   LAPAROTOMY N/A 07/07/2024   Procedure: LAPAROTOMY, EXPLORATORY;  Surgeon: Darcel Pool, MD;  Location: [facility];  Service: Gynecology;  Laterality: N/A;   REPAIR VAGINAL CUFF N/A 02/27/2024   Procedure: REPAIR, VAGINAL CUFF;  Surgeon: Darcel Pool, MD;  Location: [facility];  Service: Gynecology;  Laterality: N/A;   REPAIR VAGINAL CUFF N/A 07/07/2024   Procedure: REPAIR, VAGINAL CUFF;  Surgeon: Darcel Pool, MD;  Location: [facility];  Service:  Gynecology;  Laterality: N/A;  REPAIR LAPAROTOMY   ROBOTIC ASSISTED TOTAL HYSTERECTOMY WITH BILATERAL SALPINGO OOPHERECTOMY Bilateral 11/14/2023   Procedure: XI ROBOTIC ASSISTED TOTAL HYSTERECTOMY WITH BILATERAL SALPINGECTOMY;  Surgeon: Darcel Pool, MD;  Location: [facility];  Service: Gynecology;  Laterality: Bilateral;  [facility]     Medications:   Current Facility-Administered Medications:    acetaminophen  (OFIRMEV ) IV 1,000 mg, 1,000 mg, Intravenous, Q6H PRN, Rivard, Pool, MD   ceFAZolin  (ANCEF ) IVPB 2g/100 mL premix, 2 g, Intravenous, Q8H, Powell, Elmira, PA-C, Last Rate: 200 mL/hr at 07/08/24 0614, 2 g at 07/08/24 9385   HYDROmorphone  (DILAUDID ) injection 2 mg, 2 mg, Intravenous, Q4H PRN, Perri, Elmira, PA-C, 2 mg at 07/07/24 2212   ketorolac  (TORADOL ) 30 MG/ML injection 30 mg, 30 mg, Intravenous, Q6H, Powell, Elmira, PA-C, 30 mg at 07/08/24 1226   lactated ringers  infusion, , Intravenous, Continuous, Powell, Elmira, PA-C, Last Rate: 125 mL/hr  at 07/08/24 1139, New Bag at 07/08/24 1139   menthol  (CEPACOL) lozenge 3 mg, 1 lozenge, Oral, Q2H PRN, Perri, Elmira, PA-C   metroNIDAZOLE (FLAGYL) IVPB 500 mg, 500 mg, Intravenous, Q12H, Powell, Elmira, PA-C, Last Rate: 100 mL/hr at 07/08/24 0320, Restarted at 07/08/24 0320   ondansetron  (ZOFRAN ) tablet 4 mg, 4 mg, Oral, Q6H PRN **OR** ondansetron  (ZOFRAN ) injection 4 mg, 4 mg, Intravenous, Q6H PRN, Perri, Elmira, PA-C   simethicone  (MYLICON) chewable tablet 80 mg, 80 mg, Oral, QID PRN, Perri Bjork, PA-C  Allergies: Allergies  Allergen Reactions   Ciprofloxacin Itching and Swelling   Hydrocodone Hives, Itching and Swelling    Alfornia Light, DO

## 2024-07-09 MED ORDER — AMOXICILLIN-POT CLAVULANATE 875-125 MG PO TABS
1.0000 | ORAL_TABLET | Freq: Two times a day (BID) | ORAL | Status: DC
  Administered 2024-07-09 – 2024-07-10 (×2): 1 via ORAL
  Filled 2024-07-09 (×2): qty 1

## 2024-07-09 MED ORDER — ACETAMINOPHEN 325 MG PO TABS
650.0000 mg | ORAL_TABLET | Freq: Four times a day (QID) | ORAL | Status: DC
  Administered 2024-07-09 – 2024-07-10 (×3): 650 mg via ORAL
  Filled 2024-07-09 (×3): qty 2

## 2024-07-09 MED ORDER — METRONIDAZOLE 500 MG PO TABS
500.0000 mg | ORAL_TABLET | Freq: Three times a day (TID) | ORAL | Status: DC
Start: 2024-07-09 — End: 2024-07-10
  Administered 2024-07-09 – 2024-07-10 (×2): 500 mg via ORAL
  Filled 2024-07-09 (×2): qty 1

## 2024-07-09 MED ORDER — IBUPROFEN 600 MG PO TABS
600.0000 mg | ORAL_TABLET | Freq: Four times a day (QID) | ORAL | Status: DC | PRN
  Administered 2024-07-10: 600 mg via ORAL
  Filled 2024-07-09: qty 1

## 2024-07-09 NOTE — Plan of Care (Signed)

## 2024-07-09 NOTE — Progress Notes (Addendum)
 Donna [name] is a37 y.o.  [id number]  Post Op Date # 2:Vaginal Cuff Dehiscence and Incidental Cystotomy Repair  Subjective: Patient is Doing well postoperatively. Patient has Pain is controlled with current analgesics. Medications being used: prescription NSAID's including ketorolac  (Toradol ) and narcotic analgesics including hydromorphone  (Dilaudid )., Continues to have some lower back pain but improved from yesterday. Ambulating slowly, tolerating clear liquids with no nausea and reports being hungry.  Has not passed flatus. Foley is draining clear yellow urine with 150 cc currently in bag.   Objective: Vital signs in last 24 hours: Temp:  [98 F (36.7 C)-98.8 F (37.1 C)] 98 F (36.7 C) (10/30 0449) Pulse Rate:  [66-89] 67 (10/30 0449) Resp:  [16-17] 17 (10/30 0449) BP: (94-111)/(50-64) 102/53 (10/30 0449) SpO2:  [96 %-100 %] 96 % (10/30 0449)  Intake/Output from previous day: 10/29 0701 - 10/30 0700 In: 3384.2 [P.O.:180; I.V.:2725] Out: 1275 [Urine:1275] Intake/Output this shift: No intake/output data recorded. Recent Labs  Lab 07/07/24 1349 07/08/24 0538  WBC 10.9* 11.8*  HGB 15.1* 12.3  HCT 43.3 34.7*  PLT 272 225    No results for input(s): NA, K, CL, CO2, BUN, CREATININE, CALCIUM, PROT, BILITOT, ALKPHOS, ALT, AST, GLUCOSE in the last 168 hours.  Invalid input(s): LABALBU  EXAM: General: alert, cooperative, and no distress Resp: clear to auscultation bilaterally Cardio: regular rate and rhythm, S1, S2 normal, no murmur, click, rub or gallop GI: increased bowel sounds, abdomen slightly distended; wound dressing is dry and intact with a single dried stain on right aspect. Extremities: Homans sign is negative, no sign of DVT   Assessment: s/p Procedure(s): REPAIR, VAGINAL CUFF LAPAROTOMY, EXPLORATORY CYSTOSCOPY REPAIR, BLADDER: stable and progressing well  Plan: Encourage ambulation and hydration Give warm prune/apple juice to  help with bowel function Change to full liquid diet Routine care.  LOS: 2 days    Madolyn Monte, PA-C 07/09/2024 7:36 AM  18:25 Patient seen Ambulating and tolerating normal diet. Pain is well managed with Toradol  Passing flatus Normal urine output with clear urine.  Will move to all oral meds and pursue Foley catheter/bag management this evening Plan discharge in the morning

## 2024-07-10 MED ORDER — TRAMADOL HCL 50 MG PO TABS: ORAL_TABLET | ORAL | 0 refills | Status: AC

## 2024-07-10 MED ORDER — KETOROLAC TROMETHAMINE 10 MG PO TABS: ORAL_TABLET | ORAL | 0 refills | Status: AC

## 2024-07-10 NOTE — Progress Notes (Signed)
 Donna [name] is a37 y.o.  [id number]  Post Op Date # 3: S/P Laparotomy with Repair of Vaginal Cuff Dehiscence and Incidental Cystotomy/Cystoscopy  Subjective: Patient is Doing well postoperatively. Patient has Pain is controlled with current analgesics. Medications being used: prescription NSAID's including ketorolac  (Toradol )., Tolerating a regular diet, passing flatus, ambulating in the halls and copious clear urine in Foley bag.   Objective: Vital signs in last 24 hours: Temp:  [98 F (36.7 C)-98.3 F (36.8 C)] 98 F (36.7 C) (10/30 2348) Pulse Rate:  [64-82] 82 (10/30 2348) Resp:  [16-18] 16 (10/30 2348) BP: (108-118)/(56-69) 114/56 (10/30 2348) SpO2:  [99 %-100 %] 100 % (10/30 2348)  Intake/Output from previous day: 10/30 0701 - 10/31 0700 In: 2090.3 [P.O.:480; I.V.:1610.3] Out: 2350 [Urine:2350] Intake/Output this shift: No intake/output data recorded. Recent Labs  Lab 07/07/24 1349 07/08/24 0538  WBC 10.9* 11.8*  HGB 15.1* 12.3  HCT 43.3 34.7*  PLT 272 225    No results for input(s): NA, K, CL, CO2, BUN, CREATININE, CALCIUM, PROT, BILITOT, ALKPHOS, ALT, AST, GLUCOSE in the last 168 hours.  Invalid input(s): LABALBU  EXAM: General: alert, cooperative, and no distress Resp: clear to auscultation bilaterally Cardio: regular rate and rhythm, S1, S2 normal, no murmur, click, rub or gallop GI: bowel sounds present, soft, dressing dry and intact with same dried stain at right aspect of dressing. Extremities: Homans sign is negative, no sign of DVT and no calf tenderness.   Assessment: s/p Procedure(s): REPAIR, VAGINAL CUFF LAPAROTOMY, EXPLORATORY CYSTOSCOPY REPAIR, BLADDER: stable, progressing well, and tolerating diet  Plan: Discharge home Follow up in 2 weeks for retrograde urogram and Foley removal as indicated with post operative visit. Additional follow up visit in 6 weeks.  LOS: 3 days    Madolyn Monte, PA-C 07/10/2024  7:29 AM

## 2024-07-10 NOTE — Discharge Instructions (Signed)
 Call [facility] at [phone] if:   You have a temperature greater than or equal to 100.4 degrees Farenheit orally You have pain that is not made better by the pain medication given and taken as directed You have excessive bleeding or problems urinating  Take Colace (Docusate Sodium /Stool Softener) 100 mg 2-3 times daily while taking narcotic pain medicine to avoid constipation or until bowel movements are regular. Take Ketorlac 10 mg with food and Acetaminophen  1000 mg (#2-500 mg) tablets every 6 hours for 5 days then, as needed, replace the Ketorolac  with Ibuprofen  600 mg with Acetaminophen  1000 mg every 6 hours as needed.  You may drive after 2 weeks You may walk up steps  You may shower  You may resume a regular diet  Keep incisions clean and dry; remove honeycomb dressing on Monday, July 13, 2024. Do not lift over 15 pounds for 6 weeks Avoid anything in vagina until advised by surgeon.

## 2024-07-10 NOTE — Plan of Care (Signed)

## 2024-07-10 NOTE — Discharge Summary (Addendum)
 Physician Discharge Summary  Patient ID: Donna [name] MRN: [id number] DOB/AGE: [birth year] 38 y.o.  Admit date: 07/07/2024 Discharge date: 07/10/2024   Discharge Diagnoses:  Principal Problem:   Vaginal cuff dehiscence Active Problems:   Vaginal cuff dehiscence, subsequent encounter   Adjustment disorder with anxiety   Operation: Laparotomy with Repair of Vaginal Cuff Dehiscence and Incidental Cystotomy followed by Cystoscopy   Discharged Condition: Good  Hospital Course: On the date of admission the patient urgently underwent the aforementioned procedures due to bowel evisceration through vaginal cuff dehiscence.  She tolerated procedure well though she sustained an incidental bladder injury that was repaired by Uro-Gynecologist Dr. Rosaline Caper. Post operative course was marked by ileus that resolved by post operative day #2.  By post operative day #3 the patient had resumed bowel function, had good pain control and indwelling Foley drained copious clear fluid and therefore the patient was discharged home with a Foley leg bag.  Discharge hemoglobin was 12.3.  Disposition: Discharge disposition: 01-Home or Self Care       Discharge Medications:  Allergies as of 07/10/2024       Reactions   Ciprofloxacin Itching, Swelling   Hydrocodone Hives, Itching, Swelling        Medication List     STOP taking these medications    ibuprofen  600 MG tablet Commonly known as: ADVIL        TAKE these medications    acetaminophen  500 MG tablet Commonly known as: TYLENOL  Take 2 tablets (1000 mg) every 6 hours for 5 days then as needed for post operative pain   ketorolac  10 MG tablet Commonly known as: TORADOL  take 1 tablet po pc every 6 hours x 5 days for post operative pain then begin Ibuprofen  600 mg   traMADol  50 MG tablet Commonly known as: Ultram  take 1 tablet po every 6 hours as needed for breakthrough post operative pain What changed: Another medication  with the same name was added. Make sure you understand how and when to take each.   traMADol  50 MG tablet Commonly known as: Ultram  take 1 tablet po every 6 hours prn for breakthrough post operative pain What changed: You were already taking a medication with the same name, and this prescription was added. Make sure you understand how and when to take each.      Augmentin 875-125 mg 1 tablet po every 12 hours (to complete 14 day course) Metronidazole 500 mg 1 tablet po every 8 hours (to complete a 14 day course)    Follow-up: 2 weeks Retrograde Urogram with Removal of Foley Catheter                    6 weeks post operative visit     Signed: Madolyn Monte, PA-C for Dr. Nena A. Rivard 07/10/2024, 7:43 AM

## 2024-07-14 ENCOUNTER — Other Ambulatory Visit (HOSPITAL_COMMUNITY): Payer: Self-pay | Admitting: Obstetrics and Gynecology

## 2024-07-14 DIAGNOSIS — N9981 Other intraoperative complications of genitourinary system: Secondary | ICD-10-CM

## 2024-07-15 ENCOUNTER — Ambulatory Visit

## 2024-07-15 VITALS — BP 96/64 | HR 105 | Temp 97.9°F | Resp 14 | Ht 64.75 in | Wt 161.2 lb

## 2024-07-15 DIAGNOSIS — T81328D Disruption or dehiscence of closure of other specified internal operation (surgical) wound, subsequent encounter: Secondary | ICD-10-CM | POA: Insufficient documentation

## 2024-07-15 NOTE — Progress Notes (Signed)
 Office Visit Note  Patient: Donna [name]             Date of Birth: 11-23-1985           MRN: [id number]             PCP: Pcp, No Referring: Darcel Pool, MD Visit Date: 07/15/2024 Occupation: Data Unavailable  Subjective:  New Patient (Initial Visit)  Discussed the use of AI scribe software for clinical note transcription with the patient, who gave verbal consent to proceed.  History of Present Illness Donna [name] is a 38 year old female who presents with recurrent wound healing issues following multiple surgeries. She was referred by Dr. Darcel for evaluation of potential connective tissue issues related to poor wound healing.  She underwent a hysterectomy in March 2025 due to persistent pelvic pain and fluid accumulation in her fallopian tubes, following an ablation in 2024 for heavy and painful periods. Despite attempts to manage the fluid accumulation with various medications and procedures, the condition persisted, leading to the decision for surgery.  Post-hysterectomy, she experienced complications with wound healing. In June 2025, after engaging in intercourse, she experienced severe pain and fluid leakage, leading to the discovery of an opened vaginal cuff and subsequent emergency surgery. A similar incident occurred in August 2025, requiring emergency surgical intervention due to omental prolapse and wound dehiscence. She reports ongoing issues with gas and bloating post-intercourse, but no significant pain during the act itself.  Her past medical history includes two C-sections with normal healing and a shoulder injury with normal healing. No history of hypermobility, significant illness, or recurrent infections. Family history includes heavy menstrual bleeding and a sister diagnosed with multiple sclerosis in 2017.  No rashes, dry eyes, dry mouth, hair loss, or unusual skin changes. She uses Colace and Miralax to manage constipation post-surgery.    Activities  of Daily Living:  Patient reports morning stiffness for  5-67minutes.   Patient Denies nocturnal pain.  Difficulty dressing/grooming: Denies Difficulty climbing stairs: Denies Difficulty getting out of chair: Denies Difficulty using hands for taps, buttons, cutlery, and/or writing: Denies  Review of Systems  Constitutional:  Positive for fatigue.  HENT:  Negative for mouth sores and mouth dryness.   Eyes:  Negative for dryness.  Respiratory:  Negative for shortness of breath.   Cardiovascular:  Negative for chest pain and palpitations.  Gastrointestinal:  Negative for blood in stool, constipation and diarrhea.  Endocrine: Negative for increased urination.  Genitourinary:  Negative for involuntary urination.  Musculoskeletal:  Positive for morning stiffness. Negative for joint pain, gait problem, joint pain, joint swelling, myalgias, muscle weakness, muscle tenderness and myalgias.  Skin:  Negative for color change, rash, hair loss and sensitivity to sunlight.  Allergic/Immunologic: Negative for susceptible to infections.  Neurological:  Negative for dizziness and headaches.  Hematological:  Negative for swollen glands.  Psychiatric/Behavioral:  Negative for depressed mood and sleep disturbance. The patient is not nervous/anxious.     PMFS History:  Patient Active Problem List   Diagnosis Date Noted   Adjustment disorder with anxiety 07/08/2024   Vaginal cuff dehiscence, subsequent encounter 07/07/2024   Vaginal cuff dehiscence 02/28/2024   Vaginal cuff dehiscence, initial encounter 02/27/2024   Hematometra 11/14/2023   Status post repeat low transverse cesarean section--failed TOL 02/27/2016   S/P primary low transverse C-section 02/27/2016   Polyhydramnios 02/26/2016   VBAC (vaginal birth after Cesarean) 02/07/2013   Human papilloma virus 01/22/2012   Other abnormal Papanicolaou smear of  cervix and cervical HPV(795.09) 01/22/2012   CIN I (cervical intraepithelial neoplasia I)  01/22/2012   H/O cesarean section 01/22/2012   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    Past Medical History:  Diagnosis Date   Hematometra    History of abnormal cervical Pap smear 09/10/2010   COLPO   History of herpes genitalis    Pelvic pain    Wears contact lenses     Family History  Problem Relation Age of Onset   Cancer Father        ORAL   Multiple sclerosis Sister    Tuberculosis Maternal Grandmother        Abdominal   Hypertension Maternal Grandmother    Diabetes Maternal Grandfather    Dementia Paternal Grandmother    Stomach cancer Paternal Grandfather    Osteoarthritis Son    Asthma Son    Eczema Son    Allergies Son    Early death Maternal Uncle 30       HIV   Seizures Maternal Uncle    Cancer Paternal Aunt        BREAST   Hyperlipidemia Other    Asthma Daughter    Allergies Daughter    Past Surgical History:  Procedure Laterality Date   BLADDER REPAIR  07/07/2024   Procedure: REPAIR, BLADDER;  Surgeon: Marilynne Rosaline SAILOR, MD;  Location: [facility];  Service: Gynecology;;   BREAST MASS EXCISION Left 03/19/2003   @[facility]  by dr [email or web address]. [email or web address]   CESAREAN SECTION  05/28/2009   @[facility]  by dr n. dillard   CESAREAN SECTION N/A 02/27/2016   Procedure: CESAREAN SECTION;  Surgeon: Delon Prude, DO;  Location: [facility];  Service: Obstetrics;  Laterality: N/A;   WITH BILATERAL TUBAL LIGATION   CYSTOSCOPY  02/27/2024   Procedure: CYSTOSCOPY;  Surgeon: Darcel Pool, MD;  Location: [facility];  Service: Gynecology;;   CYSTOSCOPY N/A 07/07/2024   Procedure: CYSTOSCOPY;  Surgeon: Darcel Pool, MD;  Location: [facility];  Service: Gynecology;  Laterality: N/A;   HYSTEROSCOPY WITH NOVASURE N/A 12/20/2022   Procedure: DILATATION AND CURETTAGE; HYSTEROSCOPY WITH NOVASURE;  Surgeon: Darcel Pool, MD;  Location: [facility];  Service: Gynecology;  Laterality: N/A;   LAPAROSCOPY N/A 02/27/2024    Procedure: LAPAROSCOPY, DIAGNOSTIC;  Surgeon: Darcel Pool, MD;  Location: [facility];  Service: Gynecology;  Laterality: N/A;   LAPAROTOMY N/A 07/07/2024   Procedure: LAPAROTOMY, EXPLORATORY;  Surgeon: Darcel Pool, MD;  Location: [facility];  Service: Gynecology;  Laterality: N/A;   REPAIR VAGINAL CUFF N/A 02/27/2024   Procedure: REPAIR, VAGINAL CUFF;  Surgeon: Darcel Pool, MD;  Location: [facility];  Service: Gynecology;  Laterality: N/A;   REPAIR VAGINAL CUFF N/A 07/07/2024   Procedure: REPAIR, VAGINAL CUFF;  Surgeon: Darcel Pool, MD;  Location: [facility];  Service: Gynecology;  Laterality: N/A;  REPAIR LAPAROTOMY   REPAIR VAGINAL CUFF     ROBOTIC ASSISTED TOTAL HYSTERECTOMY WITH BILATERAL SALPINGO OOPHERECTOMY Bilateral 11/14/2023   Procedure: XI ROBOTIC ASSISTED TOTAL HYSTERECTOMY WITH BILATERAL SALPINGECTOMY;  Surgeon: Darcel Pool, MD;  Location: [facility];  Service: Gynecology;  Laterality: Bilateral;  [facility]   Social History   Tobacco Use   Smoking status: Former    Current packs/day: 1.50    Average packs/day: 1.5 packs/day for 18.0 years (27.0 ttl pk-yrs)    Types: Cigarettes    Passive exposure: Never   Smokeless tobacco: Never   Tobacco comments:  11-08-2023  pt stated quit smoking 11/ 2024,  started age 47  Vaping Use   Vaping status: Some Days   Substances: Nicotine, Flavoring   Devices: geek bar  Substance Use Topics   Alcohol use: Yes    Comment: occasional   Drug use: Never   Social History   Social History Narrative   Not on file     Immunization History  Administered Date(s) Administered   Influenza, Seasonal, Injecte, Preservative Fre 09/22/2012   Tdap 02/07/2013     Objective: Vital Signs: BP 96/64 (BP Location: Right Arm, Patient Position: Sitting, Cuff Size: Small)   Pulse (!) 105   Temp 97.9 F (36.6 C)   Resp 14   Ht 5' 4.75 (1.645 m)   Wt 161 lb 3.2 oz (73.1 kg)   LMP 11/28/2022 (Approximate)   BMI 27.03 kg/m    Physical Exam Vitals and  nursing note reviewed.  HENT:     Head: Normocephalic and atraumatic.     Nose: Nose normal.  Eyes:     Conjunctiva/sclera: Conjunctivae normal.     Pupils: Pupils are equal, round, and reactive to light.  Pulmonary:     Effort: Pulmonary effort is normal. No respiratory distress.  Skin:    General: Skin is warm and dry.  Neurological:     Mental Status: She is alert. Mental status is at baseline.  Psychiatric:        Mood and Affect: Mood normal.        Behavior: Behavior normal.      Musculoskeletal Exam:   CDAI Exam: CDAI Score: -- Patient Global: --; Provider Global: -- Swollen: --; Tender: -- Joint Exam 07/15/2024   No joint exam has been documented for this visit   There is currently no information documented on the homunculus. Go to the Rheumatology activity and complete the homunculus joint exam.  Investigation: No additional findings.  Imaging: No results found.  Recent Labs: Lab Results  Component Value Date   WBC 11.8 (H) 07/08/2024   HGB 12.3 07/08/2024   PLT 225 07/08/2024   NA 138 01/13/2024   K 4.0 01/13/2024   CL 106 01/13/2024   CO2 22 01/13/2024   GLUCOSE 93 01/13/2024   BUN 9 01/13/2024   CREATININE 1.01 (H) 01/13/2024   BILITOT 0.8 01/13/2024   ALKPHOS 59 01/13/2024   AST 18 01/13/2024   ALT 13 01/13/2024   PROT 7.0 01/13/2024   ALBUMIN 3.7 01/13/2024   CALCIUM 8.7 (L) 01/13/2024   GFRAA >90 01/28/2013    Speciality Comments: No specialty comments available.  Procedures:  No procedures performed Allergies: Ciprofloxacin and Hydrocodone   Assessment / Plan:     Visit Diagnoses:  Dehiscence of vaginal cuff, subsequent encounter Based on our clinical evaluation and review of the patient's labs/imaging, patient has no defininte inflammatory arthritis or other compelling features for clinically active systemic autoimmune disorder serving as a unifying diagnosis for patient's overall presentation. Patient does not have any features of  SLE (no objective malar rash, inflammatory arthritis, Raynaud's, alopecia, recurrent cytopenias), rheumatoid arthritis (no evidence of synovitis, negative RF/CCP), Sjogren's disease (no sicca symptoms), scleroderma (no sclerodactyly, no Raynaud's), ANCA vasculitis (no pulmonary-renal symptoms, no petechial rash, no mononeuritis multiplex), seronegative spondyloarthropathy (no enthesitis, no psoriasis, no GI symptoms), or Behcet's (no oral/genital ulcers, no ocular inflammatory disease).    As discussed with patient, poor wound healing of a single wound is not associated with any rheumatologic condition. Also discussed with patient that MCTD  is a rheumatologic autoimmune condition associated with symptoms such as raynaud's, synovitis, myositis, pleuritis/pericarditis, and/or lymphadenopathy and is not associated with poor wound healing. Discussed with patient that certain heritable connective tissue disorders, such as EDS, can be associated with a hx of poor wound healing (however this a recurrent issue, and I would have expected issues with prior c-sections) but this is not a rheumatologic condition and is not diagnosed or treated by rheumatology. Patient verbalizes understanding.  Orders: No orders of the defined types were placed in this encounter.  No orders of the defined types were placed in this encounter.   I personally spent a total of 60 minutes in the care of the patient today including preparing to see the patient, getting/reviewing separately obtained history, performing a medically appropriate exam/evaluation, counseling and educating, and documenting clinical information in the EHR.   Follow-Up Instructions: Return if symptoms worsen or fail to improve.   Asberry Claw, DO  Note - This record has been created using Animal nutritionist.  Chart creation errors have been sought, but may not always  have been located. Such creation errors do not reflect on  the standard of medical care.

## 2024-07-17 ENCOUNTER — Ambulatory Visit (HOSPITAL_COMMUNITY)
Admission: RE | Admit: 2024-07-17 | Discharge: 2024-07-17 | Disposition: A | Discharge status: Home / Self Care | Source: Ambulatory Visit | Attending: Obstetrics and Gynecology | Admitting: Obstetrics and Gynecology

## 2024-07-17 DIAGNOSIS — N9981 Other intraoperative complications of genitourinary system: Secondary | ICD-10-CM | POA: Insufficient documentation

## 2024-07-17 MED ORDER — IOTHALAMATE MEGLUMINE 17.2 % UR SOLN
250.0000 mL | Freq: Once | URETHRAL | Status: AC | PRN
  Administered 2024-07-17: 250 mL via INTRAVESICAL

## 2024-10-09 ENCOUNTER — Other Ambulatory Visit: Payer: Self-pay | Admitting: Obstetrics and Gynecology

## 2024-10-09 DIAGNOSIS — Z4889 Encounter for other specified surgical aftercare: Secondary | ICD-10-CM

## 2024-10-22 ENCOUNTER — Other Ambulatory Visit
